# Patient Record
Sex: Male | Born: 1950 | Race: White | Hispanic: No | Smoking: Former smoker
Health system: Southern US, Community
[De-identification: ages and names within clinical notes are randomized; demographics above are authoritative.]

## PROBLEM LIST (undated history)

## (undated) DIAGNOSIS — E7800 Pure hypercholesterolemia, unspecified: Secondary | ICD-10-CM

## (undated) DIAGNOSIS — N400 Enlarged prostate without lower urinary tract symptoms: Secondary | ICD-10-CM

## (undated) DIAGNOSIS — Z87442 Personal history of urinary calculi: Secondary | ICD-10-CM

## (undated) DIAGNOSIS — H8109 Meniere's disease, unspecified ear: Secondary | ICD-10-CM

## (undated) DIAGNOSIS — I10 Essential (primary) hypertension: Secondary | ICD-10-CM

## (undated) DIAGNOSIS — T148XXA Other injury of unspecified body region, initial encounter: Secondary | ICD-10-CM

## (undated) DIAGNOSIS — F419 Anxiety disorder, unspecified: Secondary | ICD-10-CM

## (undated) DIAGNOSIS — L570 Actinic keratosis: Secondary | ICD-10-CM

## (undated) DIAGNOSIS — H9190 Unspecified hearing loss, unspecified ear: Secondary | ICD-10-CM

## (undated) HISTORY — PX: HEMORRHOID SURGERY: SHX153

## (undated) HISTORY — PX: HERNIA REPAIR: SHX51

## (undated) HISTORY — DX: Actinic keratosis: L57.0

## (undated) HISTORY — PX: COLONOSCOPY: SHX174

---

## 2004-06-23 ENCOUNTER — Ambulatory Visit: Payer: Self-pay | Admitting: Unknown Physician Specialty

## 2004-07-23 ENCOUNTER — Ambulatory Visit: Payer: Self-pay | Admitting: Surgery

## 2005-11-23 ENCOUNTER — Encounter: Payer: Self-pay | Admitting: Orthopaedic Surgery

## 2005-12-15 ENCOUNTER — Encounter: Payer: Self-pay | Admitting: Orthopaedic Surgery

## 2009-06-12 ENCOUNTER — Ambulatory Visit: Payer: Self-pay | Admitting: Surgery

## 2009-11-17 ENCOUNTER — Ambulatory Visit: Payer: Self-pay | Admitting: Unknown Physician Specialty

## 2015-10-06 DIAGNOSIS — H8109 Meniere's disease, unspecified ear: Secondary | ICD-10-CM | POA: Insufficient documentation

## 2016-02-23 DIAGNOSIS — Z85828 Personal history of other malignant neoplasm of skin: Secondary | ICD-10-CM

## 2016-02-23 HISTORY — DX: Personal history of other malignant neoplasm of skin: Z85.828

## 2016-05-31 ENCOUNTER — Emergency Department
Admission: EM | Admit: 2016-05-31 | Discharge: 2016-06-01 | Disposition: A | Discharge status: Home / Self Care | Payer: Medicare HMO | Attending: Emergency Medicine | Admitting: Emergency Medicine

## 2016-05-31 ENCOUNTER — Encounter: Payer: Self-pay | Admitting: Emergency Medicine

## 2016-05-31 DIAGNOSIS — F32A Depression, unspecified: Secondary | ICD-10-CM

## 2016-05-31 DIAGNOSIS — F4325 Adjustment disorder with mixed disturbance of emotions and conduct: Secondary | ICD-10-CM | POA: Diagnosis not present

## 2016-05-31 DIAGNOSIS — Z046 Encounter for general psychiatric examination, requested by authority: Secondary | ICD-10-CM | POA: Diagnosis present

## 2016-05-31 DIAGNOSIS — F1010 Alcohol abuse, uncomplicated: Secondary | ICD-10-CM

## 2016-05-31 DIAGNOSIS — Z5181 Encounter for therapeutic drug level monitoring: Secondary | ICD-10-CM | POA: Insufficient documentation

## 2016-05-31 DIAGNOSIS — F329 Major depressive disorder, single episode, unspecified: Secondary | ICD-10-CM | POA: Insufficient documentation

## 2016-05-31 DIAGNOSIS — F4329 Adjustment disorder with other symptoms: Secondary | ICD-10-CM

## 2016-05-31 LAB — CBC
HCT: 42 % (ref 40.0–52.0)
Hemoglobin: 14.2 g/dL (ref 13.0–18.0)
MCH: 31.4 pg (ref 26.0–34.0)
MCHC: 33.8 g/dL (ref 32.0–36.0)
MCV: 92.8 fL (ref 80.0–100.0)
PLATELETS: 227 10*3/uL (ref 150–440)
RBC: 4.52 MIL/uL (ref 4.40–5.90)
RDW: 14.5 % (ref 11.5–14.5)
WBC: 6.1 10*3/uL (ref 3.8–10.6)

## 2016-05-31 LAB — COMPREHENSIVE METABOLIC PANEL
ALBUMIN: 4.5 g/dL (ref 3.5–5.0)
ALT: 36 U/L (ref 17–63)
AST: 38 U/L (ref 15–41)
Alkaline Phosphatase: 66 U/L (ref 38–126)
Anion gap: 7 (ref 5–15)
BUN: 13 mg/dL (ref 6–20)
CO2: 28 mmol/L (ref 22–32)
Calcium: 9.3 mg/dL (ref 8.9–10.3)
Chloride: 108 mmol/L (ref 101–111)
Creatinine, Ser: 1.07 mg/dL (ref 0.61–1.24)
GFR calc Af Amer: >60 mL/min (ref >60)
GLUCOSE: 93 mg/dL (ref 65–99)
Potassium: 4 mmol/L (ref 3.5–5.1)
Sodium: 143 mmol/L (ref 135–145)
TOTAL PROTEIN: 7.6 g/dL (ref 6.5–8.1)
Total Bilirubin: 0.6 mg/dL (ref 0.3–1.2)

## 2016-05-31 LAB — SALICYLATE LEVEL: Salicylate Lvl: <7 mg/dL (ref 2.8–30.0)

## 2016-05-31 LAB — URINE DRUG SCREEN, QUALITATIVE (ARMC ONLY)
AMPHETAMINES, UR SCREEN: NOT DETECTED
Barbiturates, Ur Screen: NOT DETECTED
Benzodiazepine, Ur Scrn: NOT DETECTED
CANNABINOID 50 NG, UR ~~LOC~~: NOT DETECTED
Cocaine Metabolite,Ur ~~LOC~~: NOT DETECTED
MDMA (ECSTASY) UR SCREEN: NOT DETECTED
METHADONE SCREEN, URINE: NOT DETECTED
Opiate, Ur Screen: NOT DETECTED
Phencyclidine (PCP) Ur S: NOT DETECTED
TRICYCLIC, UR SCREEN: NOT DETECTED

## 2016-05-31 LAB — ACETAMINOPHEN LEVEL: Acetaminophen (Tylenol), Serum: <10 ug/mL — ABNORMAL LOW (ref 10–30)

## 2016-05-31 LAB — ETHANOL: ALCOHOL ETHYL (B): 150 mg/dL — AB (ref <5)

## 2016-05-31 MED ORDER — TRAZODONE HCL 50 MG PO TABS
50.0000 mg | ORAL_TABLET | Freq: Every evening | ORAL | Status: DC | PRN
  Administered 2016-05-31: 50 mg via ORAL
  Filled 2016-05-31: qty 1

## 2016-05-31 MED ORDER — VITAMIN B-1 100 MG PO TABS
100.0000 mg | ORAL_TABLET | Freq: Every day | ORAL | Status: DC
  Administered 2016-06-01: 100 mg via ORAL
  Filled 2016-05-31: qty 1

## 2016-05-31 MED ORDER — CHLORDIAZEPOXIDE HCL 10 MG PO CAPS
10.0000 mg | ORAL_CAPSULE | Freq: Three times a day (TID) | ORAL | Status: DC | PRN
  Administered 2016-06-01 (×2): 10 mg via ORAL
  Filled 2016-05-31 (×2): qty 1

## 2016-05-31 NOTE — ED Notes (Signed)
Report given to SOC 

## 2016-05-31 NOTE — ED Notes (Addendum)
SOC finished. This RN went to remove [facility] machine.  Pt lying in bed.  Informed pt that he would be moved to [facility].  Pt sts that he broke one hearing aid (R) by lying on side of head.  Broken hearing aid (2 pieces) placed in speicmen cup.  Pt label placed on cup and placed in pt belongings bag.

## 2016-05-31 NOTE — ED Notes (Signed)
Pt. To BHU from ED ambulatory without difficulty, to room  BHU2. Report from [facility]. Pt. Is alert and oriented, warm and dry in no distress. Pt. Denies SI, HI, and AVH. Pt states he just said something he should have not said and that is why he is here. Pt states he did not mean what he said. Pt asking for something to help him sleep. EDP made aware. Pt. Calm and cooperative. Pt. Made aware of security cameras and Q15 minute rounds. Pt. Encouraged to let Nursing staff know of any concerns or needs.

## 2016-05-31 NOTE — ED Notes (Signed)

## 2016-05-31 NOTE — ED Provider Notes (Signed)
[facility] Emergency Department Provider Note   ____________________________________________   I have reviewed the triage vital signs and the nursing notes.   HISTORY  Chief Complaint Psychiatric Evaluation   History limited by: Not Limited   HPI Scott Cochran is a 66 y.o. male who presents to the emergency department today because of concern for suicidal ideation and under IVC. The patient's wife is currently ill and is under care of hospice. The patient has understandably upset about this and has talked to his primary care doctor. The patient was placed on depression medication however is no longer taking that. The patient was with hospice today and was upset over the care of his wife. He stated that he wanted to kill himself and the hospice nurse took out IVC paperwork. Patient currently denies any SI. Denies any medical complaints.    History reviewed. No pertinent past medical history.  There are no active problems to display for this patient.   History reviewed. No pertinent surgical history.  Prior to Admission medications   Not on File    Allergies Patient has no known allergies.  History reviewed. No pertinent family history.  Social History Social History  Substance Use Topics  . Smoking status: Never Smoker  . Smokeless tobacco: Never Used  . Alcohol use Yes    Review of Systems  Constitutional: Negative for fever. Cardiovascular: Negative for chest pain. Respiratory: Negative for shortness of breath. Gastrointestinal: Negative for abdominal pain, vomiting and diarrhea. Genitourinary: Negative for dysuria. Musculoskeletal: Negative for back pain. Skin: Negative for rash. Neurological: Negative for headaches, focal weakness or numbness. Psychiatric: Positive for depression 10-point ROS otherwise negative.  ____________________________________________   PHYSICAL EXAM:  VITAL SIGNS: ED Triage Vitals  Enc Vitals Group      BP 05/31/16 1806 (!) 154/92     Pulse Rate 05/31/16 1806 93     Resp 05/31/16 1806 18     Temp 05/31/16 1806 98 F (36.7 C)     Temp Source 05/31/16 1806 Oral     SpO2 05/31/16 1806 96 %     Weight 05/31/16 1806 178 lb (80.7 kg)     Height 05/31/16 1806 5\' 10"  (1.778 m)     Head Circumference --      Peak Flow --      Pain Score 05/31/16 1817 0   Constitutional: Alert and oriented. Depressed, tearful.  Eyes: Conjunctivae are normal. Normal extraocular movements. ENT   Head: Normocephalic and atraumatic.   Nose: No congestion/rhinnorhea.   Mouth/Throat: Mucous membranes are moist.   Neck: No stridor. Hematological/Lymphatic/Immunilogical: No cervical lymphadenopathy. Cardiovascular: Normal rate, regular rhythm.  No murmurs, rubs, or gallops.  Respiratory: Normal respiratory effort without tachypnea nor retractions. Breath sounds are clear and equal bilaterally. No wheezes/rales/rhonchi. Gastrointestinal: Soft and non tender. No rebound. No guarding.  Genitourinary: Deferred Musculoskeletal: Normal range of motion in all extremities. No lower extremity edema. Neurologic:  Normal speech and language. No gross focal neurologic deficits are appreciated.  Skin:  Skin is warm, dry and intact. No rash noted. Psychiatric: Depressed, tearful.  ____________________________________________    LABS (pertinent positives/negatives)  Labs Reviewed  ETHANOL - Abnormal; Notable for the following:       Result Value   Alcohol, Ethyl (B) 150 (*)    All other components within normal limits  ACETAMINOPHEN LEVEL - Abnormal; Notable for the following:    Acetaminophen (Tylenol), Serum <10 (*)    All other components within normal limits  COMPREHENSIVE METABOLIC PANEL  SALICYLATE LEVEL  CBC  URINE DRUG SCREEN, QUALITATIVE ([facility])     ____________________________________________   EKG  None  ____________________________________________     RADIOLOGY  None  ____________________________________________   PROCEDURES  Procedures  ____________________________________________   INITIAL IMPRESSION / ASSESSMENT AND PLAN / ED COURSE  Pertinent labs & imaging results that were available during my care of the patient were reviewed by me and considered in my medical decision making (see chart for details).  Patient presented to the emergency department under IVC for SI. Patient is clearly depressed secondary to wife's illness. Will continue IVC and have psychiatry evaluate.   SOC recommends inpatient admission at this time, [facility] did recommend some medications. ____________________________________________   FINAL CLINICAL IMPRESSION(S) / ED DIAGNOSES  Final diagnoses:  Depression, unspecified depression type     Note: This dictation was prepared with Dragon dictation. Any transcriptional errors that result from this process are unintentional     Nance Pear, MD 05/31/16 2358

## 2016-05-31 NOTE — BH Assessment (Signed)
Probation officer spoke with [facility] Department, [employer] [phone]. Candida Peeling). He states the patient was placed under IVC by his wife's Designer, fashion/clothing, due to the statement, "I can just get a gun and shoot myself." Officer further reports, when he arrived to the home, the patient was calm, cooperative and pleasant. Both patient's son and grandson was at the home and reported the patient was not a threat to himself or anyone else. "He must have said it out of frustration." With the officer, patient denied SI/HI and AV/H. The incident occurred in patient's home. HOSPICE comes to their home.  Patient was up majority of the night with his wife and haven't had much sleep, per the report of the officer.

## 2016-05-31 NOTE — ED Notes (Signed)
ivc 

## 2016-05-31 NOTE — ED Notes (Signed)
ED BHU [place] Is the patient under IVC or is there intent for IVC: Yes.   Is the patient medically cleared: Yes.   Is there vacancy in the ED BHU: Yes.   Is the population mix appropriate for patient: Yes.   Is the patient awaiting placement in inpatient or outpatient setting: Yes.   Has the patient had a psychiatric consult: Yes.   Survey of unit performed for contraband, proper placement and condition of furniture, tampering with fixtures in bathroom, shower, and each patient room: Yes.  ; Findings: NA APPEARANCE/BEHAVIOR calm, cooperative and adequate rapport can be established NEURO ASSESSMENT Orientation: time, place and person Hallucinations: No.None noted (Hallucinations) Speech: Normal Gait: normal RESPIRATORY ASSESSMENT Normal expansion.  Clear to auscultation.  No rales, rhonchi, or wheezing. CARDIOVASCULAR ASSESSMENT regular rate and rhythm, S1, S2 normal, no murmur, click, rub or gallop GASTROINTESTINAL ASSESSMENT soft, nontender, BS WNL, no r/g EXTREMITIES normal strength, tone, and muscle mass, no deformities, no erythema, induration, or nodules PLAN OF CARE Provide calm/safe environment. Vital signs assessed twice daily. ED BHU Assessment once each 12-hour shift. Collaborate with intake RN daily or as condition indicates. Assure the ED provider has rounded once each shift. Provide and encourage hygiene. Provide redirection as needed. Assess for escalating behavior; address immediately and inform ED provider.  Assess family dynamic and appropriateness for visitation as needed: Yes.  ; If necessary, describe findings: NA Educate the patient/family about BHU procedures/visitation: Yes.  ; If necessary, describe findings: NA

## 2016-05-31 NOTE — ED Notes (Signed)
SOC at bedside. 

## 2016-05-31 NOTE — ED Notes (Signed)
Report received from [facility]. Patient to be placed in [place].

## 2016-05-31 NOTE — ED Notes (Signed)
Pts grandson given pts pass code [phone] w/ pts permission.  Informed pt that he would be able to talk w/ grandson outside of normal phone hours this evening

## 2016-05-31 NOTE — ED Notes (Signed)
Update given to family w/ pass code

## 2016-05-31 NOTE — ED Triage Notes (Addendum)
Pt to ed with IVC papers.  Pt wife is in hospice care at home and states he has been so tired and sleep deprived.  Pt wife has been in pain, and he is her primary caregiver and has to give his wife morphine for pain.  Pt states he has not been sleeping well for a long time. Papers are from hospice social worker who reports pt is depressed.  Pt denies SI, denies HI.

## 2016-06-01 DIAGNOSIS — F4325 Adjustment disorder with mixed disturbance of emotions and conduct: Secondary | ICD-10-CM

## 2016-06-01 DIAGNOSIS — F1010 Alcohol abuse, uncomplicated: Secondary | ICD-10-CM

## 2016-06-01 NOTE — ED Provider Notes (Signed)
-----------------------------------------   6:54 AM on 06/01/2016 -----------------------------------------   Blood pressure (!) 155/102, pulse 67, temperature 98.5 F (36.9 C), temperature source Oral, resp. rate 20, height 5\' 10"  (1.778 m), weight 178 lb (80.7 kg), SpO2 97 %.  The patient had no acute events since last update.  Calm and cooperative at this time.  Disposition is pending Psychiatry/Behavioral Medicine team recommendations.     Paulette Blanch, MD 06/01/16 [phone]

## 2016-06-01 NOTE — Consult Note (Signed)
[facility] Face-to-Face Psychiatry Consult   Reason for Consult:  Consult for 66 year old man brought to the emergency room under involuntary commitment Referring Physician:  [place] Patient Identification: Scott Cochran MRN:  [id number] Principal Diagnosis: Adjustment disorder with mixed disturbance of emotions and conduct Diagnosis:   Patient Active Problem List   Diagnosis Date Noted  . Adjustment disorder with mixed disturbance of emotions and conduct [F43.25] 06/01/2016  . Alcohol abuse [F10.10] 06/01/2016    Total Time spent with patient: 1 hour  Subjective:   Scott Cochran is a 66 y.o. male patient admitted with "it was my choice of words that was a problem".  HPI:  Patient interviewed. Chart reviewed. 66 year old man brought to the emergency room yesterday under involuntary commitment that was filed by hospice providers. Patient was at home yesterday in the middle of the day while hospice was there helping out with his terminally ill wife. Hospice workers overheard the patient speaking to his brother and some other members of the family. Patient admits that he has been very stressed out and was having a very emotional conversation and that he made a comment about how he might as well get a gun and shoot himself. Patient says he was intoxicated at the time having consumed much of a 750 mL bottle of liquor during the morning. He denies that he was actually intending or had any plan to kill himself. Patient states that he is under a lot of stress but doesn't feel hopeless necessarily. Doesn't feel constantly depressed. His wife is terminally ill and he is the primary caretaker at home with only a few hours a week of hospice respite right now which has been very emotionally overwhelming for him. Patient says that he sleeps adequately although he sometimes takes over-the-counter sleep medicine. His appetite is been okay. He denies any hallucinations denies any thoughts of hurting anyone else. Patient says  that he drinks about 3 times a week and usually does not drink nearly as much as he did yesterday denies any other drug abuse. Not currently receiving any psychiatric treatment.  Medical history: Patient takes Zocor but otherwise is pretty healthy no other history of major medical problems reported.  Social history: Patient's wife of 4 years is terminally ill with pulmonary fibrosis. Patient has to provide all of her care except for a few hours a week. He does get support from his extended family who live nearby and reports that he has a good relationship with all of them. Appears to be financially stable. 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 Patient is familiar with hospitalist and feels they have been very helpful.  Substance abuse history: Patient admits that he had quite a bit to drink yesterday morning and he did have an elevated alcohol level on presentation. Claims that he doesn't drink nearly that much most of the time and is only drinking a few days a week. Denies that he's had problems with alcohol or drug abuse in the past. Tends to minimize it but admits that it could be a problem right now.  Past Psychiatric History: No past psychiatric history other than having been prescribed an antidepressant by his primary care doctor at some time in the past around the time that his own father died. Patient did not feel was helpful and stopped it after a short period of time. No history of suicide attempts or hospitalization.  Risk to Self: Is patient at risk for suicide?: No Risk to Others:   Prior Inpatient Therapy:  Prior Outpatient Therapy:    Past Medical History: History reviewed. No pertinent past medical history. History reviewed. No pertinent surgical history. Family History: History reviewed. No pertinent family history. Family Psychiatric  History: He has 2 brothers who have chronic developmental disability but doesn't know of any other family mental health history Social History:   History  Alcohol Use  . Yes     History  Drug Use No    Social History   Social History  . Marital status: Married    Spouse name: N/A  . Number of children: N/A  . Years of education: N/A   Social History Main Topics  . Smoking status: Never Smoker  . Smokeless tobacco: Never Used  . Alcohol use Yes  . Drug use: No  . Sexual activity: Not Asked   Other Topics Concern  . None   Social History Narrative  . None   Additional Social History:    Allergies:  No Known Allergies  Labs:  Results for orders placed or performed during the hospital encounter of 05/31/16 (from the past 48 hour(s))  Comprehensive metabolic panel     Status: None   Collection Time: 05/31/16  6:25 PM  Result Value Ref Range   Sodium 143 135 - 145 mmol/L   Potassium 4.0 3.5 - 5.1 mmol/L   Chloride 108 101 - 111 mmol/L   CO2 28 22 - 32 mmol/L   Glucose, Bld 93 65 - 99 mg/dL   BUN 13 6 - 20 mg/dL   Creatinine, Ser 1.07 0.61 - 1.24 mg/dL   Calcium 9.3 8.9 - 10.3 mg/dL   Total Protein 7.6 6.5 - 8.1 g/dL   Albumin 4.5 3.5 - 5.0 g/dL   AST 38 15 - 41 U/L   ALT 36 17 - 63 U/L   Alkaline Phosphatase 66 38 - 126 U/L   Total Bilirubin 0.6 0.3 - 1.2 mg/dL   GFR calc non Af Amer >60 >60 mL/min   GFR calc Af Amer >60 >60 mL/min    Comment: (NOTE) The eGFR has been calculated using the CKD EPI equation. This calculation has not been validated in all clinical situations. eGFR's persistently <60 mL/min signify possible Chronic Kidney Disease.    Anion gap 7 5 - 15  Ethanol     Status: Abnormal   Collection Time: 05/31/16  6:25 PM  Result Value Ref Range   Alcohol, Ethyl (B) 150 (H) <5 mg/dL    Comment:        LOWEST DETECTABLE LIMIT FOR SERUM ALCOHOL IS 5 mg/dL FOR MEDICAL PURPOSES ONLY   Salicylate level     Status: None   Collection Time: 05/31/16  6:25 PM  Result Value Ref Range   Salicylate Lvl <5.8 2.8 - 30.0 mg/dL  Acetaminophen level     Status: Abnormal   Collection Time: 05/31/16   6:25 PM  Result Value Ref Range   Acetaminophen (Tylenol), Serum <10 (L) 10 - 30 ug/mL    Comment:        THERAPEUTIC CONCENTRATIONS VARY SIGNIFICANTLY. A RANGE OF 10-30 ug/mL MAY BE AN EFFECTIVE CONCENTRATION FOR MANY PATIENTS. HOWEVER, SOME ARE BEST TREATED AT CONCENTRATIONS OUTSIDE THIS RANGE. ACETAMINOPHEN CONCENTRATIONS >150 ug/mL AT 4 HOURS AFTER INGESTION AND >50 ug/mL AT 12 HOURS AFTER INGESTION ARE OFTEN ASSOCIATED WITH TOXIC REACTIONS.   cbc     Status: None   Collection Time: 05/31/16  6:25 PM  Result Value Ref Range   WBC 6.1 3.8 - 10.6  K/uL   RBC 4.52 4.40 - 5.90 MIL/uL   Hemoglobin 14.2 13.0 - 18.0 g/dL   HCT 42.0 40.0 - 52.0 %   MCV 92.8 80.0 - 100.0 fL   MCH 31.4 26.0 - 34.0 pg   MCHC 33.8 32.0 - 36.0 g/dL   RDW 14.5 11.5 - 14.5 %   Platelets 227 150 - 440 K/uL  Urine Drug Screen, Qualitative     Status: None   Collection Time: 05/31/16  6:25 PM  Result Value Ref Range   Tricyclic, Ur Screen NONE DETECTED NONE DETECTED   Amphetamines, Ur Screen NONE DETECTED NONE DETECTED   MDMA (Ecstasy)Ur Screen NONE DETECTED NONE DETECTED   Cocaine Metabolite,Ur [place] NONE DETECTED NONE DETECTED   Opiate, Ur Screen NONE DETECTED NONE DETECTED   Phencyclidine (PCP) Ur S NONE DETECTED NONE DETECTED   Cannabinoid 50 Ng, Ur [place] NONE DETECTED NONE DETECTED   Barbiturates, Ur Screen NONE DETECTED NONE DETECTED   Benzodiazepine, Ur Scrn NONE DETECTED NONE DETECTED   Methadone Scn, Ur NONE DETECTED NONE DETECTED    Comment: (NOTE) 034  Tricyclics, urine               Cutoff 1000 ng/mL 200  Amphetamines, urine             Cutoff 1000 ng/mL 300  MDMA (Ecstasy), urine           Cutoff 500 ng/mL 400  Cocaine Metabolite, urine       Cutoff 300 ng/mL 500  Opiate, urine                   Cutoff 300 ng/mL 600  Phencyclidine (PCP), urine      Cutoff 25 ng/mL 700  Cannabinoid, urine              Cutoff 50 ng/mL 800  Barbiturates, urine             Cutoff 200 ng/mL 900   Benzodiazepine, urine           Cutoff 200 ng/mL 1000 Methadone, urine                Cutoff 300 ng/mL 1100 1200 The urine drug screen provides only a preliminary, unconfirmed 1300 analytical test result and should not be used for non-medical 1400 purposes. Clinical consideration and professional judgment should 1500 be applied to any positive drug screen result due to possible 1600 interfering substances. A more specific alternate chemical method 1700 must be used in order to obtain a confirmed analytical result.  1800 Gas chromato graphy / mass spectrometry (GC/MS) is the preferred 1900 confirmatory method.     Current Facility-Administered Medications  Medication Dose Route Frequency Provider Last Rate Last Dose  . chlordiazePOXIDE (LIBRIUM) capsule 10 mg  10 mg Oral TID PRN Nance Pear, MD   10 mg at 06/01/16 0935  . thiamine (VITAMIN B-1) tablet 100 mg  100 mg Oral Daily Nance Pear, MD   100 mg at 06/01/16 0935  . traZODone (DESYREL) tablet 50 mg  50 mg Oral QHS PRN Nance Pear, MD   50 mg at 05/31/16 2233   Current Outpatient Prescriptions  Medication Sig Dispense Refill  . simvastatin (ZOCOR) 40 MG tablet Take 40 mg by mouth daily.      Musculoskeletal: Strength & Muscle Tone: within normal limits Gait & Station: normal Patient leans: N/A  Psychiatric Specialty Exam: Physical Exam  Nursing note and vitals reviewed. Constitutional: He appears well-developed and well-nourished.  HENT:  Head: Normocephalic and atraumatic.  Eyes: Conjunctivae are normal. Pupils are equal, round, and reactive to light.  Neck: Normal range of motion.  Cardiovascular: Regular rhythm and normal heart sounds.   Respiratory: Effort normal. No respiratory distress.  GI: Soft.  Musculoskeletal: Normal range of motion.  Neurological: He is alert.  Skin: Skin is warm and dry.  Psychiatric: He has a normal mood and affect. His speech is normal and behavior is normal. Judgment and  thought content normal. Cognition and memory are normal.    Review of Systems  Constitutional: Negative.   HENT: Negative.   Eyes: Negative.   Respiratory: Negative.   Cardiovascular: Negative.   Gastrointestinal: Negative.   Musculoskeletal: Negative.   Skin: Negative.   Neurological: Negative.   Psychiatric/Behavioral: Positive for substance abuse and suicidal ideas. Negative for depression, hallucinations and memory loss. The patient is not nervous/anxious and does not have insomnia.     Blood pressure (!) 155/102, pulse 67, temperature 98.5 F (36.9 C), temperature source Oral, resp. rate 20, height 5' 10"  (1.778 m), weight 80.7 kg (178 lb), SpO2 97 %.Body mass index is 25.54 kg/m.  General Appearance: Fairly Groomed  Eye Contact:  Good  Speech:  Clear and Coherent  Volume:  Normal  Mood:  Euthymic  Affect:  Constricted  Thought Process:  Goal Directed  Orientation:  Full (Time, Place, and Person)  Thought Content:  Logical  Suicidal Thoughts:  No  Homicidal Thoughts:  No  Memory:  Immediate;   Good Recent;   Fair Remote;   Fair  Judgement:  Fair  Insight:  Fair  Psychomotor Activity:  Normal  Concentration:  Concentration: Fair  Recall:  [employer] of Knowledge:  Fair  Language:  Fair  Akathisia:  No  Handed:  Right  AIMS (if indicated):     Assets:  Communication Skills Desire for Improvement Financial Resources/Insurance Housing Physical Health Resilience Social Support  ADL's:  Intact  Cognition:  WNL  Sleep:        Treatment Plan Summary: Plan 66 year old man who made a suicidal statement yesterday but had not shown any action on it. He was intoxicated at the time. He has consistently denied any suicidal intent or plan since then and is not giving a history of a full depression. Patient is willing to admit that the alcohol could create problems and that he needs to cut back on it. He shows reasonable insight into how his mood could potentially be a  problem. Patient does have firearms at home but agrees to continue keeping them locked up so that he at least does not have immediate impulsive access to them. He is encouraged to discuss further problems with mood with his primary care doctor or with the hospitalist providers. Also has family support. At this point does not meet commitment criteria does not need inpatient hospitalization. Case reviewed with TTS and emergency room doctor. Discontinue IVC. Patient can be released from the emergency room.  Disposition: Patient does not meet criteria for psychiatric inpatient admission. Supportive therapy provided about ongoing stressors.  Alethia Berthold, MD 06/01/2016 1:35 PM

## 2016-06-01 NOTE — ED Provider Notes (Signed)
-----------------------------------------   1:23 PM on 06/01/2016 -----------------------------------------  Evaluated by Dr. Lissa Hoard packs of psychiatry, he feels the patient is not a danger to self or others. Patient has no SI or HI and contracts for safety. Dr. of psychiatry has reversed the patient's IVC and advises discharge. Patient will return for new or worrisome symptoms.   Schuyler Amor, MD 06/01/16 1323

## 2016-06-01 NOTE — ED Notes (Signed)
CIWA= 7 PRN Librium given.

## 2016-06-01 NOTE — ED Notes (Addendum)
Continues to be calm and cooperative. Appears depressed but does brighten in conversation. Continues to be hopeful for D/C and downplays the comments he made about self harm. Does not endorse withdrawal symptoms and no subjective signs noted. He denies SI/HI/AVH and contracts for safety. Support and encouragement provided. Denies pain and/or discomfort. He offeres no additional questions or concerns. Safety has been maintained with q15 min obs, hourly rounding and ODS obs. Will continue current POC pending disposition.

## 2016-06-01 NOTE — ED Notes (Signed)
Pt stable for D/C per Dr. Weber Cooks. Continues to deny SI/HI/AVH. He denies pain and or discomfort. No acute distress noted. Will follow up with his PCP for management of Depression. AVS provided and reviewed. Understanding verbalized. He was escorted to the lobby where he will wait on his family to pick him up.

## 2018-06-20 DIAGNOSIS — F5104 Psychophysiologic insomnia: Secondary | ICD-10-CM | POA: Insufficient documentation

## 2019-04-30 ENCOUNTER — Other Ambulatory Visit: Payer: Self-pay | Admitting: Dermatology

## 2019-04-30 ENCOUNTER — Other Ambulatory Visit: Payer: Self-pay

## 2019-05-01 ENCOUNTER — Other Ambulatory Visit: Payer: Self-pay | Admitting: General Surgery

## 2019-05-02 NOTE — Telephone Encounter (Signed)
Error

## 2019-05-14 ENCOUNTER — Other Ambulatory Visit: Payer: Self-pay

## 2019-05-14 ENCOUNTER — Encounter
Admission: RE | Admit: 2019-05-14 | Discharge: 2019-05-14 | Disposition: A | Discharge status: Home / Self Care | Payer: Medicare HMO | Source: Ambulatory Visit | Attending: General Surgery | Admitting: General Surgery

## 2019-05-14 HISTORY — DX: Other injury of unspecified body region, initial encounter: T14.8XXA

## 2019-05-14 HISTORY — DX: Anxiety disorder, unspecified: F41.9

## 2019-05-14 HISTORY — DX: Essential (primary) hypertension: I10

## 2019-05-14 HISTORY — DX: Meniere's disease, unspecified ear: H81.09

## 2019-05-14 HISTORY — DX: Pure hypercholesterolemia, unspecified: E78.00

## 2019-05-14 HISTORY — DX: Benign prostatic hyperplasia without lower urinary tract symptoms: N40.0

## 2019-05-14 HISTORY — DX: Personal history of urinary calculi: Z87.442

## 2019-05-14 HISTORY — DX: Unspecified hearing loss, unspecified ear: H91.90

## 2019-05-14 NOTE — Patient Instructions (Addendum)
Your procedure is scheduled on: Friday 4/9 Report to Day Surgery. To find out your arrival time please call [phone] between 1PM - 3PM on Thurs 4/8  Remember: Instructions that are not followed completely may result in serious medical risk,  up to and including death, or upon the discretion of your surgeon and anesthesiologist your  surgery may need to be rescheduled.     _X__ 1. You will be on a clear liquid diet for your colonoscopy.  You must stop drinking 2 hours before your arrival.                 No gum chewing or hard candies. DO not drink clear                 liquids within 2 hours of the start of your surgery.                 Clear Liquids include:  water, apple juice without pulp, clear Gatorade, G2 or                  Gatorade Zero (avoid Red/Purple/Blue), Black Coffee or Tea (Do not add                 anything to coffee or tea). _____2.   Complete the carbohydrate drink provided to you, 2 hours before arrival.  __X__2.  On the morning of surgery brush your teeth with toothpaste and water, you                may rinse your mouth with mouthwash if you wish.  Do not swallow any toothpaste of mouthwash.     _X__ 3.  No Alcohol for 24 hours before or after surgery.   ___ 4.  Do Not Smoke or use e-cigarettes For 24 Hours Prior to Your Surgery.                 Do not use any chewable tobacco products for at least 6 hours prior to                 surgery.  ____  5.  Bring all medications with you on the day of surgery if instructed.   __x__  6.  Notify your doctor if there is any change in your medical condition      (cold, fever, infections).     Do not wear jewelry,  Do not wear lotions, You may wear deodorant. Do not shave 48 hours prior to surgery. Men may shave face and neck. Do not bring valuables to the hospital.    [facility] is not responsible for any belongings or valuables.  Contacts, dentures or bridgework may not be worn into  surgery. Leave your suitcase in the car. After surgery it may be brought to your room. For patients admitted to the hospital, discharge time is determined by your treatment team.   Patients discharged the day of surgery will not be allowed to drive home.   Make arrangements for someone to be with you for the first 24 hours of your Same Day Discharge.    Please read over the following fact sheets that you were given:       __x__ Take these medicines the morning of surgery with A SIP OF WATER:    1. cetirizine (ZYRTEC) 5 MG tablet if needed  2. simvastatin (ZOCOR) 40 MG tablet  3.   4.  5.  6.  ____ Fleet Enema (as  directed)   __x__ Use CHG Soap (or wipes) as directed  ____ Use Benzoyl Peroxide Gel as instructed  ____ Use inhalers on the day of surgery  ____ Stop metformin 2 days prior to surgery    ____ Take 1/2 of usual insulin dose the night before surgery. No insulin the morning          of surgery.   ____ Stop Coumadin/Plavix/aspirin   __x__ Stop Anti-inflammatories No ibuprofen aleve or aspirin until after the surgery  May take tylenol   __x__ Stop supplements until after surgery.  ascorbic acid (VITAMIN C) 500 MG tablet  ____ Bring C-Pap to the hospital.

## 2019-05-16 ENCOUNTER — Encounter
Admission: RE | Admit: 2019-05-16 | Discharge: 2019-05-16 | Disposition: A | Discharge status: Home / Self Care | Payer: Medicare HMO | Source: Ambulatory Visit | Attending: General Surgery | Admitting: General Surgery

## 2019-05-16 ENCOUNTER — Other Ambulatory Visit: Payer: Self-pay

## 2019-05-16 DIAGNOSIS — Z01810 Encounter for preprocedural cardiovascular examination: Secondary | ICD-10-CM | POA: Diagnosis not present

## 2019-05-16 DIAGNOSIS — R001 Bradycardia, unspecified: Secondary | ICD-10-CM | POA: Insufficient documentation

## 2019-05-16 DIAGNOSIS — Z01812 Encounter for preprocedural laboratory examination: Secondary | ICD-10-CM | POA: Insufficient documentation

## 2019-05-16 LAB — POTASSIUM: Potassium: 4.4 mmol/L (ref 3.5–5.1)

## 2019-05-22 ENCOUNTER — Other Ambulatory Visit: Payer: Self-pay

## 2019-05-22 ENCOUNTER — Other Ambulatory Visit
Admission: RE | Admit: 2019-05-22 | Discharge: 2019-05-22 | Disposition: A | Discharge status: Home / Self Care | Payer: Medicare HMO | Source: Ambulatory Visit | Attending: General Surgery | Admitting: General Surgery

## 2019-05-22 DIAGNOSIS — Z01812 Encounter for preprocedural laboratory examination: Secondary | ICD-10-CM | POA: Diagnosis present

## 2019-05-22 DIAGNOSIS — Z20822 Contact with and (suspected) exposure to covid-19: Secondary | ICD-10-CM | POA: Insufficient documentation

## 2019-05-22 LAB — SARS CORONAVIRUS 2 (TAT 6-24 HRS): SARS Coronavirus 2: NEGATIVE

## 2019-05-24 ENCOUNTER — Other Ambulatory Visit: Payer: Self-pay

## 2019-05-24 ENCOUNTER — Encounter
Admission: RE | Disposition: A | Discharge status: Home / Self Care | Payer: Self-pay | Source: Home / Self Care | Attending: General Surgery

## 2019-05-24 ENCOUNTER — Encounter: Payer: Self-pay | Admitting: General Surgery

## 2019-05-24 ENCOUNTER — Ambulatory Visit
Admission: RE | Admit: 2019-05-24 | Discharge: 2019-05-24 | Disposition: A | Discharge status: Home / Self Care | Payer: Medicare HMO | Attending: General Surgery | Admitting: General Surgery

## 2019-05-24 ENCOUNTER — Ambulatory Visit: Payer: Medicare HMO | Admitting: Registered Nurse

## 2019-05-24 DIAGNOSIS — Z1211 Encounter for screening for malignant neoplasm of colon: Secondary | ICD-10-CM | POA: Diagnosis not present

## 2019-05-24 DIAGNOSIS — Z882 Allergy status to sulfonamides status: Secondary | ICD-10-CM | POA: Insufficient documentation

## 2019-05-24 DIAGNOSIS — Z79899 Other long term (current) drug therapy: Secondary | ICD-10-CM | POA: Diagnosis not present

## 2019-05-24 DIAGNOSIS — Z800 Family history of malignant neoplasm of digestive organs: Secondary | ICD-10-CM | POA: Diagnosis not present

## 2019-05-24 DIAGNOSIS — Z87891 Personal history of nicotine dependence: Secondary | ICD-10-CM | POA: Diagnosis not present

## 2019-05-24 DIAGNOSIS — F419 Anxiety disorder, unspecified: Secondary | ICD-10-CM | POA: Insufficient documentation

## 2019-05-24 DIAGNOSIS — K5730 Diverticulosis of large intestine without perforation or abscess without bleeding: Secondary | ICD-10-CM | POA: Diagnosis not present

## 2019-05-24 DIAGNOSIS — K621 Rectal polyp: Secondary | ICD-10-CM | POA: Insufficient documentation

## 2019-05-24 DIAGNOSIS — K4091 Unilateral inguinal hernia, without obstruction or gangrene, recurrent: Secondary | ICD-10-CM | POA: Diagnosis not present

## 2019-05-24 DIAGNOSIS — E7800 Pure hypercholesterolemia, unspecified: Secondary | ICD-10-CM | POA: Diagnosis not present

## 2019-05-24 DIAGNOSIS — I10 Essential (primary) hypertension: Secondary | ICD-10-CM | POA: Insufficient documentation

## 2019-05-24 HISTORY — PX: COLONOSCOPY WITH PROPOFOL: SHX5780

## 2019-05-24 HISTORY — PX: INGUINAL HERNIA REPAIR: SHX194

## 2019-05-24 SURGERY — COLONOSCOPY WITH PROPOFOL
Anesthesia: General

## 2019-05-24 SURGERY — REPAIR, HERNIA, INGUINAL, ADULT
Anesthesia: General | Laterality: Right

## 2019-05-24 MED ORDER — MIDAZOLAM HCL 2 MG/2ML IJ SOLN
INTRAMUSCULAR | Status: DC | PRN
  Administered 2019-05-24 (×2): 1 mg via INTRAVENOUS

## 2019-05-24 MED ORDER — ONDANSETRON HCL 4 MG/2ML IJ SOLN
INTRAMUSCULAR | Status: DC | PRN
  Administered 2019-05-24: 4 mg via INTRAVENOUS

## 2019-05-24 MED ORDER — SODIUM CHLORIDE 0.9 % IV SOLN: INTRAVENOUS | Status: DC

## 2019-05-24 MED ORDER — BUPIVACAINE HCL (PF) 0.5 % IJ SOLN
INTRAMUSCULAR | Status: AC
  Filled 2019-05-24: qty 30

## 2019-05-24 MED ORDER — LABETALOL HCL 5 MG/ML IV SOLN
INTRAVENOUS | Status: AC
  Filled 2019-05-24: qty 4

## 2019-05-24 MED ORDER — CEFAZOLIN SODIUM-DEXTROSE 2-4 GM/100ML-% IV SOLN
INTRAVENOUS | Status: AC
  Filled 2019-05-24: qty 100

## 2019-05-24 MED ORDER — LIDOCAINE HCL URETHRAL/MUCOSAL 2 % EX GEL
CUTANEOUS | Status: AC
  Filled 2019-05-24: qty 5

## 2019-05-24 MED ORDER — LIDOCAINE HCL (PF) 2 % IJ SOLN
INTRAMUSCULAR | Status: AC
  Filled 2019-05-24: qty 5

## 2019-05-24 MED ORDER — ACETAMINOPHEN 10 MG/ML IV SOLN
INTRAVENOUS | Status: AC
  Filled 2019-05-24: qty 100

## 2019-05-24 MED ORDER — FENTANYL CITRATE (PF) 100 MCG/2ML IJ SOLN: 25.0000 ug | INTRAMUSCULAR | Status: DC | PRN

## 2019-05-24 MED ORDER — FAMOTIDINE 20 MG PO TABS
20.0000 mg | ORAL_TABLET | Freq: Once | ORAL | Status: AC
  Administered 2019-05-24: 08:00:00 20 mg via ORAL

## 2019-05-24 MED ORDER — KETOROLAC TROMETHAMINE 30 MG/ML IJ SOLN
INTRAMUSCULAR | Status: AC
  Filled 2019-05-24: qty 1

## 2019-05-24 MED ORDER — ONDANSETRON HCL 4 MG/2ML IJ SOLN
INTRAMUSCULAR | Status: AC
  Filled 2019-05-24: qty 2

## 2019-05-24 MED ORDER — CEFAZOLIN SODIUM-DEXTROSE 2-4 GM/100ML-% IV SOLN
2.0000 g | INTRAVENOUS | Status: AC
  Administered 2019-05-24: 2 g via INTRAVENOUS

## 2019-05-24 MED ORDER — ACETAMINOPHEN 10 MG/ML IV SOLN
INTRAVENOUS | Status: DC | PRN
  Administered 2019-05-24: 1000 mg via INTRAVENOUS

## 2019-05-24 MED ORDER — EPINEPHRINE PF 1 MG/ML IJ SOLN
INTRAMUSCULAR | Status: AC
  Filled 2019-05-24: qty 1

## 2019-05-24 MED ORDER — BUPIVACAINE-EPINEPHRINE (PF) 0.5% -1:200000 IJ SOLN
INTRAMUSCULAR | Status: DC | PRN
  Administered 2019-05-24: 30 mL

## 2019-05-24 MED ORDER — PROPOFOL 10 MG/ML IV BOLUS
INTRAVENOUS | Status: AC
  Filled 2019-05-24: qty 20

## 2019-05-24 MED ORDER — BISOPROLOL FUMARATE 5 MG PO TABS
2.5000 mg | ORAL_TABLET | Freq: Once | ORAL | Status: AC
  Administered 2019-05-24: 08:00:00 2.5 mg via ORAL
  Filled 2019-05-24: qty 0.5

## 2019-05-24 MED ORDER — SODIUM CHLORIDE 0.9 % IV SOLN
INTRAVENOUS | Status: DC | PRN
  Administered 2019-05-24: 30 ug/min via INTRAVENOUS

## 2019-05-24 MED ORDER — HYDROCODONE-ACETAMINOPHEN 5-325 MG PO TABS: 1.0000 | ORAL_TABLET | ORAL | 0 refills | Status: AC | PRN

## 2019-05-24 MED ORDER — MEPERIDINE HCL 50 MG/ML IJ SOLN: 6.2500 mg | INTRAMUSCULAR | Status: DC | PRN

## 2019-05-24 MED ORDER — FENTANYL CITRATE (PF) 100 MCG/2ML IJ SOLN
INTRAMUSCULAR | Status: AC
  Filled 2019-05-24: qty 2

## 2019-05-24 MED ORDER — OXYCODONE HCL 5 MG/5ML PO SOLN: 5.0000 mg | Freq: Once | ORAL | Status: DC | PRN

## 2019-05-24 MED ORDER — PROPOFOL 500 MG/50ML IV EMUL
INTRAVENOUS | Status: DC | PRN
  Administered 2019-05-24: 140 ug/kg/min via INTRAVENOUS

## 2019-05-24 MED ORDER — EPHEDRINE SULFATE 50 MG/ML IJ SOLN
INTRAMUSCULAR | Status: DC | PRN
  Administered 2019-05-24: 10 mg via INTRAVENOUS
  Administered 2019-05-24 (×2): 5 mg via INTRAVENOUS
  Administered 2019-05-24: 10 mg via INTRAVENOUS

## 2019-05-24 MED ORDER — OXYCODONE HCL 5 MG PO TABS: 5.0000 mg | ORAL_TABLET | Freq: Once | ORAL | Status: DC | PRN

## 2019-05-24 MED ORDER — PROPOFOL 500 MG/50ML IV EMUL
INTRAVENOUS | Status: AC
  Filled 2019-05-24: qty 50

## 2019-05-24 MED ORDER — LABETALOL HCL 5 MG/ML IV SOLN
5.0000 mg | Freq: Once | INTRAVENOUS | Status: AC
  Administered 2019-05-24: 11:00:00 5 mg via INTRAVENOUS

## 2019-05-24 MED ORDER — MIDAZOLAM HCL 2 MG/2ML IJ SOLN
INTRAMUSCULAR | Status: AC
  Filled 2019-05-24: qty 2

## 2019-05-24 MED ORDER — LIDOCAINE HCL (CARDIAC) PF 100 MG/5ML IV SOSY
PREFILLED_SYRINGE | INTRAVENOUS | Status: DC | PRN
  Administered 2019-05-24: 100 mg via INTRAVENOUS

## 2019-05-24 MED ORDER — DEXAMETHASONE SODIUM PHOSPHATE 10 MG/ML IJ SOLN
INTRAMUSCULAR | Status: AC
  Filled 2019-05-24: qty 1

## 2019-05-24 MED ORDER — LACTATED RINGERS IV SOLN: INTRAVENOUS | Status: DC

## 2019-05-24 MED ORDER — DEXAMETHASONE SODIUM PHOSPHATE 10 MG/ML IJ SOLN
INTRAMUSCULAR | Status: DC | PRN
  Administered 2019-05-24: 8 mg via INTRAVENOUS

## 2019-05-24 MED ORDER — FENTANYL CITRATE (PF) 100 MCG/2ML IJ SOLN
INTRAMUSCULAR | Status: DC | PRN
  Administered 2019-05-24 (×2): 25 ug via INTRAVENOUS

## 2019-05-24 MED ORDER — FAMOTIDINE 20 MG PO TABS
ORAL_TABLET | ORAL | Status: AC
  Filled 2019-05-24: qty 1

## 2019-05-24 MED ORDER — PROMETHAZINE HCL 25 MG/ML IJ SOLN: 6.2500 mg | INTRAMUSCULAR | Status: DC | PRN

## 2019-05-24 MED ORDER — EPHEDRINE 5 MG/ML INJ
INTRAVENOUS | Status: AC
  Filled 2019-05-24: qty 10

## 2019-05-24 MED ORDER — PROPOFOL 10 MG/ML IV BOLUS
INTRAVENOUS | Status: DC | PRN
  Administered 2019-05-24: 70 mg via INTRAVENOUS
  Administered 2019-05-24: 20 mg via INTRAVENOUS
  Administered 2019-05-24: 50 mg via INTRAVENOUS
  Administered 2019-05-24: 30 mg via INTRAVENOUS

## 2019-05-24 SURGICAL SUPPLY — 34 items
BLADE SURG 15 STRL SS SAFETY (BLADE) ×6 IMPLANT
CANISTER SUCT 1200ML W/VALVE (MISCELLANEOUS) ×3 IMPLANT
CHLORAPREP W/TINT 26 (MISCELLANEOUS) ×3 IMPLANT
CLOSURE WOUND 1/2 X4 (GAUZE/BANDAGES/DRESSINGS) ×1
COVER WAND RF STERILE (DRAPES) ×3 IMPLANT
DECANTER SPIKE VIAL GLASS SM (MISCELLANEOUS) ×3 IMPLANT
DRAIN PENROSE 1/4X12 LTX STRL (WOUND CARE) ×3 IMPLANT
DRAPE LAPAROTOMY 100X77 ABD (DRAPES) ×3 IMPLANT
DRSG TEGADERM 4X4.75 (GAUZE/BANDAGES/DRESSINGS) ×3 IMPLANT
DRSG TELFA 4X3 1S NADH ST (GAUZE/BANDAGES/DRESSINGS) ×3 IMPLANT
ELECT REM PT RETURN 9FT ADLT (ELECTROSURGICAL) ×3
ELECTRODE REM PT RTRN 9FT ADLT (ELECTROSURGICAL) ×1 IMPLANT
GLOVE BIO SURGEON STRL SZ7.5 (GLOVE) ×3 IMPLANT
GLOVE INDICATOR 8.0 STRL GRN (GLOVE) ×6 IMPLANT
GOWN STRL REUS W/ TWL LRG LVL3 (GOWN DISPOSABLE) ×2 IMPLANT
GOWN STRL REUS W/TWL LRG LVL3 (GOWN DISPOSABLE) ×4
KIT TURNOVER KIT A (KITS) ×3 IMPLANT
LABEL OR SOLS (LABEL) ×3 IMPLANT
MESH MARLEX PLUG MEDIUM (Mesh General) ×3 IMPLANT
NEEDLE HYPO 22GX1.5 SAFETY (NEEDLE) ×6 IMPLANT
PACK BASIN MINOR ARMC (MISCELLANEOUS) ×3 IMPLANT
STRIP CLOSURE SKIN 1/2X4 (GAUZE/BANDAGES/DRESSINGS) ×2 IMPLANT
SUT PDS AB 0 CT1 27 (SUTURE) IMPLANT
SUT SURGILON 0 BLK (SUTURE) ×3 IMPLANT
SUT VIC AB 2-0 SH 27 (SUTURE) ×2
SUT VIC AB 2-0 SH 27XBRD (SUTURE) ×1 IMPLANT
SUT VIC AB 3-0 54X BRD REEL (SUTURE) ×1 IMPLANT
SUT VIC AB 3-0 BRD 54 (SUTURE) ×2
SUT VIC AB 3-0 SH 27 (SUTURE) ×2
SUT VIC AB 3-0 SH 27X BRD (SUTURE) ×1 IMPLANT
SUT VIC AB 4-0 FS2 27 (SUTURE) ×3 IMPLANT
SWABSTK COMLB BENZOIN TINCTURE (MISCELLANEOUS) ×3 IMPLANT
SYR 10ML LL (SYRINGE) ×3 IMPLANT
SYR 3ML LL SCALE MARK (SYRINGE) ×3 IMPLANT

## 2019-05-24 NOTE — Progress Notes (Signed)
Labetalol 5mg  given for blood pressure of 141/95

## 2019-05-24 NOTE — H&P (Signed)
Scott Cochran NB:[id number] Jan 08, 1951     HPI:  Family history of colon cancer.  New recurrent right inguinal hernia. For colonoscopy and right inguinal hernia repair.   Medications Prior to Admission  Medication Sig Dispense Refill Last Dose  . acetaminophen (TYLENOL) 500 MG tablet Take 1,000 mg by mouth every 6 (six) hours as needed for moderate pain.   05/23/2019 at Unknown time  . ascorbic acid (VITAMIN C) 500 MG tablet Take 500 mg by mouth 2 (two) times a week.   Past Week at Unknown time  . bisoprolol-hydrochlorothiazide (ZIAC) 2.5-6.25 MG tablet Take 1 tablet by mouth daily.   05/23/2019 at Unknown time  . cetirizine (ZYRTEC) 5 MG tablet Take 5 mg by mouth daily.   05/23/2019 at Unknown time  . diazepam (VALIUM) 5 MG tablet Take 5 mg by mouth 2 (two) times daily as needed for anxiety.   Past Week at Unknown time  . docusate sodium (COLACE) 100 MG capsule Take 100 mg by mouth daily.   Past Week at Unknown time  . mometasone (ELOCON) 0.1 % lotion Apply 1 application topically daily as needed (psoriasis).      . Multiple Vitamin (MULTIVITAMIN WITH MINERALS) TABS tablet Take 1 tablet by mouth daily.   Past Week at Unknown time  . silodosin (RAPAFLO) 8 MG CAPS capsule Take 8 mg by mouth at bedtime.   05/23/2019 at Unknown time  . simvastatin (ZOCOR) 40 MG tablet Take 20 mg by mouth daily.    Past Week at Unknown time  . vitamin B-12 (CYANOCOBALAMIN) 500 MCG tablet Take 500 mcg by mouth 2 (two) times a week.   Past Week at Unknown time   Allergies  Allergen Reactions  . Sulfa Antibiotics Other (See Comments)    unknown 25 years ago   Past Medical History:  Diagnosis Date  . Anxiety   . BPH (benign prostatic hyperplasia)   . Fracture    left arm  . Hearing loss   . History of kidney stones   . Hx of basal cell carcinoma 02/23/2016   R infranasal medial to superior nasolabial  . Hypercholesterolemia   . Hypertension   . Meniere's disease    Past Surgical History:  Procedure Laterality Date   . COLONOSCOPY    . HEMORRHOID SURGERY    . HERNIA REPAIR Right    Social History   Socioeconomic History  . Marital status: Widowed    Spouse name: Not on file  . Number of children: Not on file  . Years of education: Not on file  . Highest education level: Not on file  Occupational History  . Not on file  Tobacco Use  . Smoking status: Former Research scientist (life sciences)  . Smokeless tobacco: Never Used  . Tobacco comment: 35 years ago  Substance and Sexual Activity  . Alcohol use: Yes    Comment: rarely  . Drug use: No  . Sexual activity: Not on file  Other Topics Concern  . Not on file  Social History Narrative  . Not on file   Social Determinants of Health   Financial Resource Strain:   . Difficulty of Paying Living Expenses:   Food Insecurity:   . Worried About Charity fundraiser in the Last Year:   . Arboriculturist in the Last Year:   Transportation Needs:   . Film/video editor (Medical):   Marland Kitchen Lack of Transportation (Non-Medical):   Physical Activity:   . Days of Exercise per  Week:   . Minutes of Exercise per Session:   Stress:   . Feeling of Stress :   Social Connections:   . Frequency of Communication with Friends and Family:   . Frequency of Social Gatherings with Friends and Family:   . Attends Religious Services:   . Active Member of Clubs or Organizations:   . Attends Archivist Meetings:   Marland Kitchen Marital Status:   Intimate Partner Violence:   . Fear of Current or Ex-Partner:   . Emotionally Abused:   Marland Kitchen Physically Abused:   . Sexually Abused:    Social History   Social History Narrative  . Not on file     ROS: Negative.     PE: HEENT: Negative. Lungs: Clear. Cardio: RR.   Assessment/Plan:  Proceed with planned endoscopy.  [facility] 05/24/2019

## 2019-05-24 NOTE — Transfer of Care (Signed)
Immediate Anesthesia Transfer of Care Note  Patient: Scott Cochran  Procedure(s) Performed: HERNIA REPAIR INGUINAL ADULT (Right )  Patient Location: PACU  Anesthesia Type:General  Level of Consciousness: sedated  Airway & Oxygen Therapy: Patient Spontanous Breathing and Patient connected to face mask oxygen  Post-op Assessment: Report given to RN and Post -op Vital signs reviewed and stable  Post vital signs: Reviewed and stable  Last Vitals:  Vitals Value Taken Time  BP 154/85 05/24/19 1046  Temp 36.3 C 05/24/19 1046  Pulse 68 05/24/19 1047  Resp 13 05/24/19 1047  SpO2 100 % 05/24/19 1047  Vitals shown include unvalidated device data.  Last Pain:  Vitals:   05/24/19 0812  TempSrc: Tympanic  PainSc: 0-No pain         Complications: No apparent anesthesia complications

## 2019-05-24 NOTE — Discharge Instructions (Signed)

## 2019-05-24 NOTE — Anesthesia Procedure Notes (Signed)
Procedure Name: LMA Insertion Date/Time: 05/24/2019 9:51 AM Performed by: Hedda Slade, CRNA Pre-anesthesia Checklist: Patient identified, Patient being monitored, Timeout performed, Emergency Drugs available and Suction available Patient Re-evaluated:Patient Re-evaluated prior to induction Oxygen Delivery Method: Circle system utilized Preoxygenation: Pre-oxygenation with 100% oxygen Induction Type: IV induction Ventilation: Mask ventilation without difficulty LMA: LMA inserted LMA Size: 4.5 Tube type: Oral Number of attempts: 1 Placement Confirmation: positive ETCO2 and breath sounds checked- equal and bilateral Tube secured with: Tape Dental Injury: Teeth and Oropharynx as per pre-operative assessment

## 2019-05-24 NOTE — Anesthesia Preprocedure Evaluation (Signed)
Anesthesia Evaluation  Patient identified by MRN, date of birth, ID band Patient awake    Reviewed: Allergy & Precautions, NPO status , Patient's Chart, lab work & pertinent test results  History of Anesthesia Complications Negative for: history of anesthetic complications  Airway Mallampati: II  TM Distance: >3 FB Neck ROM: Full    Dental  (+) Upper Dentures, Lower Dentures   Pulmonary neg sleep apnea, neg COPD, former smoker,    breath sounds clear to auscultation- rhonchi (-) wheezing      Cardiovascular Exercise Tolerance: Good hypertension, Pt. on medications (-) CAD, (-) Past MI, (-) Cardiac Stents and (-) CABG  Rhythm:Regular Rate:Normal - Systolic murmurs and - Diastolic murmurs    Neuro/Psych neg Seizures PSYCHIATRIC DISORDERS Anxiety negative neurological ROS     GI/Hepatic negative GI ROS, Neg liver ROS,   Endo/Other  negative endocrine ROSneg diabetes  Renal/GU negative Renal ROS     Musculoskeletal negative musculoskeletal ROS (+)   Abdominal (+) - obese,   Peds  Hematology negative hematology ROS (+)   Anesthesia Other Findings Past Medical History: No date: Anxiety No date: BPH (benign prostatic hyperplasia) No date: Fracture     Comment:  left arm No date: Hearing loss No date: History of kidney stones 02/23/2016: Hx of basal cell carcinoma     Comment:  R infranasal medial to superior nasolabial No date: Hypercholesterolemia No date: Hypertension No date: Meniere's disease   Reproductive/Obstetrics                             Anesthesia Physical Anesthesia Plan  ASA: II  Anesthesia Plan: General   Post-op Pain Management:    Induction: Intravenous  PONV Risk Score and Plan: 1 and Dexamethasone and Ondansetron  Airway Management Planned: LMA  Additional Equipment:   Intra-op Plan:   Post-operative Plan:   Informed Consent: I have reviewed the  patients History and Physical, chart, labs and discussed the procedure including the risks, benefits and alternatives for the proposed anesthesia with the patient or authorized representative who has indicated his/her understanding and acceptance.     Dental advisory given  Plan Discussed with: CRNA and Anesthesiologist  Anesthesia Plan Comments:         Anesthesia Quick Evaluation

## 2019-05-24 NOTE — Anesthesia Postprocedure Evaluation (Signed)
Anesthesia Post Note  Patient: Scott Cochran  Procedure(s) Performed: HERNIA REPAIR INGUINAL ADULT (Right )  Patient location during evaluation: PACU Anesthesia Type: General Level of consciousness: awake and alert Pain management: pain level controlled Vital Signs Assessment: post-procedure vital signs reviewed and stable Respiratory status: spontaneous breathing, nonlabored ventilation and respiratory function stable Cardiovascular status: blood pressure returned to baseline and stable Postop Assessment: no apparent nausea or vomiting Anesthetic complications: no     Last Vitals:  Vitals:   05/24/19 1210 05/24/19 1241  BP: (!) 151/92 (!) 144/86  Pulse: 61 63  Resp: 18 18  Temp: (!) 36.1 C (!) 36.1 C  SpO2: 97% 97%    Last Pain:  Vitals:   05/24/19 1241  TempSrc: Temporal  PainSc: 0-No pain                 Brett Canales Benton Tooker

## 2019-05-25 ENCOUNTER — Emergency Department: Payer: Medicare HMO

## 2019-05-25 ENCOUNTER — Observation Stay
Admission: EM | Admit: 2019-05-25 | Discharge: 2019-05-26 | Disposition: A | Discharge status: Home / Self Care | Payer: Medicare HMO | Attending: Internal Medicine | Admitting: Internal Medicine

## 2019-05-25 ENCOUNTER — Other Ambulatory Visit: Payer: Self-pay

## 2019-05-25 ENCOUNTER — Encounter: Payer: Self-pay | Admitting: Emergency Medicine

## 2019-05-25 ENCOUNTER — Observation Stay: Payer: Medicare HMO

## 2019-05-25 DIAGNOSIS — R29810 Facial weakness: Secondary | ICD-10-CM | POA: Insufficient documentation

## 2019-05-25 DIAGNOSIS — R930 Abnormal findings on diagnostic imaging of skull and head, not elsewhere classified: Secondary | ICD-10-CM | POA: Diagnosis not present

## 2019-05-25 DIAGNOSIS — Z87891 Personal history of nicotine dependence: Secondary | ICD-10-CM | POA: Diagnosis not present

## 2019-05-25 DIAGNOSIS — E785 Hyperlipidemia, unspecified: Secondary | ICD-10-CM | POA: Insufficient documentation

## 2019-05-25 DIAGNOSIS — I10 Essential (primary) hypertension: Secondary | ICD-10-CM | POA: Diagnosis not present

## 2019-05-25 DIAGNOSIS — I639 Cerebral infarction, unspecified: Secondary | ICD-10-CM | POA: Diagnosis not present

## 2019-05-25 DIAGNOSIS — D72829 Elevated white blood cell count, unspecified: Secondary | ICD-10-CM | POA: Diagnosis present

## 2019-05-25 DIAGNOSIS — R4781 Slurred speech: Secondary | ICD-10-CM | POA: Insufficient documentation

## 2019-05-25 DIAGNOSIS — F419 Anxiety disorder, unspecified: Secondary | ICD-10-CM | POA: Diagnosis present

## 2019-05-25 DIAGNOSIS — Z20822 Contact with and (suspected) exposure to covid-19: Secondary | ICD-10-CM | POA: Diagnosis not present

## 2019-05-25 DIAGNOSIS — Z79899 Other long term (current) drug therapy: Secondary | ICD-10-CM | POA: Diagnosis not present

## 2019-05-25 DIAGNOSIS — H9190 Unspecified hearing loss, unspecified ear: Secondary | ICD-10-CM | POA: Insufficient documentation

## 2019-05-25 DIAGNOSIS — G459 Transient cerebral ischemic attack, unspecified: Secondary | ICD-10-CM | POA: Diagnosis present

## 2019-05-25 DIAGNOSIS — I6782 Cerebral ischemia: Secondary | ICD-10-CM | POA: Insufficient documentation

## 2019-05-25 DIAGNOSIS — N179 Acute kidney failure, unspecified: Secondary | ICD-10-CM | POA: Diagnosis present

## 2019-05-25 DIAGNOSIS — N400 Enlarged prostate without lower urinary tract symptoms: Secondary | ICD-10-CM | POA: Insufficient documentation

## 2019-05-25 DIAGNOSIS — Z85828 Personal history of other malignant neoplasm of skin: Secondary | ICD-10-CM | POA: Diagnosis not present

## 2019-05-25 DIAGNOSIS — E7800 Pure hypercholesterolemia, unspecified: Secondary | ICD-10-CM | POA: Diagnosis present

## 2019-05-25 LAB — COMPREHENSIVE METABOLIC PANEL
ALT: 34 U/L (ref 0–44)
AST: 38 U/L (ref 15–41)
Albumin: 4.3 g/dL (ref 3.5–5.0)
Alkaline Phosphatase: 67 U/L (ref 38–126)
Anion gap: 8 (ref 5–15)
BUN: 16 mg/dL (ref 8–23)
CO2: 26 mmol/L (ref 22–32)
Calcium: 9.1 mg/dL (ref 8.9–10.3)
Chloride: 105 mmol/L (ref 98–111)
Creatinine, Ser: 1.53 mg/dL — ABNORMAL HIGH (ref 0.61–1.24)
GFR calc Af Amer: 52 mL/min — ABNORMAL LOW (ref >60)
GFR calc non Af Amer: 45 mL/min — ABNORMAL LOW (ref >60)
Glucose, Bld: 89 mg/dL (ref 70–99)
Potassium: 3.5 mmol/L (ref 3.5–5.1)
Sodium: 139 mmol/L (ref 135–145)
Total Bilirubin: 0.8 mg/dL (ref 0.3–1.2)
Total Protein: 7 g/dL (ref 6.5–8.1)

## 2019-05-25 LAB — CBC
HCT: 40.1 % (ref 39.0–52.0)
Hemoglobin: 13.3 g/dL (ref 13.0–17.0)
MCH: 30.8 pg (ref 26.0–34.0)
MCHC: 33.2 g/dL (ref 30.0–36.0)
MCV: 92.8 fL (ref 80.0–100.0)
Platelets: 167 10*3/uL (ref 150–400)
RBC: 4.32 MIL/uL (ref 4.22–5.81)
RDW: 14.7 % (ref 11.5–15.5)
WBC: 12.7 10*3/uL — ABNORMAL HIGH (ref 4.0–10.5)
nRBC: 0 % (ref 0.0–0.2)

## 2019-05-25 LAB — DIFFERENTIAL
Abs Immature Granulocytes: 0.06 10*3/uL (ref 0.00–0.07)
Basophils Absolute: 0 10*3/uL (ref 0.0–0.1)
Basophils Relative: 0 %
Eosinophils Absolute: 0 10*3/uL (ref 0.0–0.5)
Eosinophils Relative: 0 %
Immature Granulocytes: 1 %
Lymphocytes Relative: 15 %
Lymphs Abs: 2 10*3/uL (ref 0.7–4.0)
Monocytes Absolute: 0.6 10*3/uL (ref 0.1–1.0)
Monocytes Relative: 5 %
Neutro Abs: 10 10*3/uL — ABNORMAL HIGH (ref 1.7–7.7)
Neutrophils Relative %: 79 %

## 2019-05-25 LAB — GLUCOSE, CAPILLARY: Glucose-Capillary: 131 mg/dL — ABNORMAL HIGH (ref 70–99)

## 2019-05-25 LAB — PROTIME-INR
INR: 1 (ref 0.8–1.2)
Prothrombin Time: 12.6 seconds (ref 11.4–15.2)

## 2019-05-25 LAB — APTT: aPTT: 24 seconds (ref 24–36)

## 2019-05-25 LAB — SARS CORONAVIRUS 2 (TAT 6-24 HRS): SARS Coronavirus 2: NEGATIVE

## 2019-05-25 MED ORDER — ASPIRIN EC 81 MG PO TBEC
81.0000 mg | DELAYED_RELEASE_TABLET | Freq: Every day | ORAL | Status: DC
  Administered 2019-05-26: 81 mg via ORAL
  Filled 2019-05-25 (×2): qty 1

## 2019-05-25 MED ORDER — ENOXAPARIN SODIUM 40 MG/0.4ML ~~LOC~~ SOLN
40.0000 mg | SUBCUTANEOUS | Status: DC
  Administered 2019-05-25: 21:00:00 40 mg via SUBCUTANEOUS
  Filled 2019-05-25: qty 0.4

## 2019-05-25 MED ORDER — HYDRALAZINE HCL 20 MG/ML IJ SOLN
5.0000 mg | INTRAMUSCULAR | Status: DC | PRN
  Administered 2019-05-25: 19:00:00 5 mg via INTRAVENOUS
  Filled 2019-05-25 (×3): qty 0.25

## 2019-05-25 MED ORDER — ONDANSETRON HCL 4 MG/2ML IJ SOLN: 4.0000 mg | Freq: Three times a day (TID) | INTRAMUSCULAR | Status: DC | PRN

## 2019-05-25 MED ORDER — ACETAMINOPHEN 650 MG RE SUPP: 650.0000 mg | RECTAL | Status: DC | PRN

## 2019-05-25 MED ORDER — STROKE: EARLY STAGES OF RECOVERY BOOK: Freq: Once | Status: AC

## 2019-05-25 MED ORDER — DIAZEPAM 5 MG PO TABS: 5.0000 mg | ORAL_TABLET | Freq: Two times a day (BID) | ORAL | Status: DC | PRN

## 2019-05-25 MED ORDER — SIMVASTATIN 20 MG PO TABS
20.0000 mg | ORAL_TABLET | Freq: Every day | ORAL | Status: DC
  Administered 2019-05-26: 09:00:00 20 mg via ORAL
  Filled 2019-05-25 (×2): qty 1

## 2019-05-25 MED ORDER — LORATADINE 10 MG PO TABS
10.0000 mg | ORAL_TABLET | Freq: Every day | ORAL | Status: DC
  Administered 2019-05-26: 10 mg via ORAL
  Filled 2019-05-25: qty 1

## 2019-05-25 MED ORDER — ACETAMINOPHEN 160 MG/5ML PO SOLN
650.0000 mg | ORAL | Status: DC | PRN
  Filled 2019-05-25: qty 20.3

## 2019-05-25 MED ORDER — DOCUSATE SODIUM 100 MG PO CAPS: 100.0000 mg | ORAL_CAPSULE | Freq: Every day | ORAL | Status: DC | PRN

## 2019-05-25 MED ORDER — TAMSULOSIN HCL 0.4 MG PO CAPS
0.4000 mg | ORAL_CAPSULE | Freq: Every day | ORAL | Status: DC
  Administered 2019-05-25 – 2019-05-26 (×2): 0.4 mg via ORAL
  Filled 2019-05-25 (×3): qty 1

## 2019-05-25 MED ORDER — ACETAMINOPHEN 325 MG PO TABS: 650.0000 mg | ORAL_TABLET | ORAL | Status: DC | PRN

## 2019-05-25 MED ORDER — SODIUM CHLORIDE 0.9 % IV SOLN: INTRAVENOUS | Status: DC

## 2019-05-25 MED ORDER — BISOPROLOL FUMARATE 5 MG PO TABS
2.5000 mg | ORAL_TABLET | Freq: Every day | ORAL | Status: DC
  Administered 2019-05-26: 09:00:00 2.5 mg via ORAL
  Filled 2019-05-25 (×2): qty 0.5

## 2019-05-25 MED ORDER — ACETAMINOPHEN 325 MG PO TABS: 650.0000 mg | ORAL_TABLET | Freq: Four times a day (QID) | ORAL | Status: DC | PRN

## 2019-05-25 MED ORDER — HYDROCODONE-ACETAMINOPHEN 5-325 MG PO TABS
1.0000 | ORAL_TABLET | ORAL | Status: DC | PRN
  Administered 2019-05-25 – 2019-05-26 (×2): 1 via ORAL
  Filled 2019-05-25 (×2): qty 1

## 2019-05-25 NOTE — ED Notes (Signed)
Reports sxs started 830am, family noted slurred speech at 1043 when talking on the phone, right side facial droop also noted. Pt states he woke up around 6am normal ate breakfasts.  Pt took ASPRIN prior to arrival, unknown how much at this time

## 2019-05-25 NOTE — ED Notes (Signed)
Attempted to call report at this time. Unable to give report, floor staff to call this RN back

## 2019-05-25 NOTE — Consult Note (Signed)
Reason for Consult: dysarthria  Requesting Physician: Dr. Quentin Cornwall  CC: dysarthria R facial droop    HPI: Scott Cochran is an 69 y.o. male with hx of HN, BPH, hearing loss, HLD patients states went to sleep last night but unable to fall asleep. He took a sleeping pill at 1AM and got out of bed around 5 AM. He was with his family and around 8:45 when speaking on the phone he had slurry speech. Family also noted R facial droop.  Currently returned to baseline and NIHSS of 0. No anti platelet therapy prior to admission.   Past Medical History:  Diagnosis Date  . Anxiety   . BPH (benign prostatic hyperplasia)   . Fracture    left arm  . Hearing loss   . History of kidney stones   . Hx of basal cell carcinoma 02/23/2016   R infranasal medial to superior nasolabial  . Hypercholesterolemia   . Hypertension   . Meniere's disease     Past Surgical History:  Procedure Laterality Date  . COLONOSCOPY    . COLONOSCOPY WITH PROPOFOL N/A 05/24/2019   Procedure: COLONOSCOPY WITH PROPOFOL;  Surgeon: Robert Bellow, MD;  Location: [facility] ENDOSCOPY;  Service: Endoscopy;  Laterality: N/A;  TRAVEL CASE TO O.R.  . HEMORRHOID SURGERY    . HERNIA REPAIR Right   . INGUINAL HERNIA REPAIR Right 05/24/2019   Procedure: HERNIA REPAIR INGUINAL ADULT;  Surgeon: Robert Bellow, MD;  Location: [facility] ORS;  Service: General;  Laterality: Right;  patient will be coming from endo    History reviewed. No pertinent family history.  Social History:  reports that he has quit smoking. He has never used smokeless tobacco. He reports current alcohol use. He reports that he does not use drugs.  Allergies  Allergen Reactions  . Sulfa Antibiotics Other (See Comments)    unknown 25 years ago    Medications: I have reviewed the patient's current medications.  ROS: History obtained from the patient  General ROS: negative for - chills, fatigue, fever, night sweats, weight gain or weight loss Psychological ROS:  negative for - behavioral disorder, hallucinations, memory difficulties, mood swings or suicidal ideation Ophthalmic ROS: negative for - blurry vision, double vision, eye pain or loss of vision ENT ROS: negative for - epistaxis, nasal discharge, oral lesions, sore throat, tinnitus or vertigo Allergy and Immunology ROS: negative for - hives or itchy/watery eyes Hematological and Lymphatic ROS: negative for - bleeding problems, bruising or swollen lymph nodes Endocrine ROS: negative for - galactorrhea, hair pattern changes, polydipsia/polyuria or temperature intolerance Respiratory ROS: negative for - cough, hemoptysis, shortness of breath or wheezing Cardiovascular ROS: negative for - chest pain, dyspnea on exertion, edema or irregular heartbeat Gastrointestinal ROS: negative for - abdominal pain, diarrhea, hematemesis, nausea/vomiting or stool incontinence Genito-Urinary ROS: negative for - dysuria, hematuria, incontinence or urinary frequency/urgency Musculoskeletal ROS: negative for - joint swelling or muscular weakness Neurological ROS: as noted in HPI Dermatological ROS: negative for rash and skin lesion changes  Physical Examination: Blood pressure (!) 160/100, pulse (!) 53, temperature 97.7 F (36.5 C), temperature source Oral, resp. rate 20, height 5\' 9"  (1.753 m), weight 84.4 kg, SpO2 98 %.    Neurological Examination   Mental Status: Alert, oriented, thought content appropriate.  Speech fluent without evidence of aphasia.  Able to follow 3 step commands without difficulty. Cranial Nerves: II: Discs flat bilaterally; Visual fields grossly normal, pupils equal, round, reactive to light and accommodation III,IV, VI: ptosis  not present, extra-ocular motions intact bilaterally V,VII: smile symmetric, facial light touch sensation normal bilaterally VIII: hearing normal bilaterally IX,X: gag reflex present XI: bilateral shoulder shrug XII: midline tongue extension Motor: Right  : Upper extremity   5/5    Left:     Upper extremity   5/5  Lower extremity   5/5     Lower extremity   5/5 Tone and bulk:normal tone throughout; no atrophy noted Sensory: Pinprick and light touch intact throughout, bilaterally Deep Tendon Reflexes: 2+ and symmetric throughout Plantars: Right: downgoing   Left: downgoing Cerebellar: normal finger-to-nose, normal rapid alternating movements and normal heel-to-shin test Gait: not tested      Laboratory Studies:   Basic Metabolic Panel: No results for input(s): NA, K, CL, CO2, GLUCOSE, BUN, CREATININE, CALCIUM, MG, PHOS in the last 168 hours.  Liver Function Tests: No results for input(s): AST, ALT, ALKPHOS, BILITOT, PROT, ALBUMIN in the last 168 hours. No results for input(s): LIPASE, AMYLASE in the last 168 hours. No results for input(s): AMMONIA in the last 168 hours.  CBC: Recent Labs  Lab 05/25/19 1152  WBC 12.7*  NEUTROABS 10.0*  HGB 13.3  HCT 40.1  MCV 92.8  PLT 167    Cardiac Enzymes: No results for input(s): CKTOTAL, CKMB, CKMBINDEX, TROPONINI in the last 168 hours.  BNP: Invalid input(s): POCBNP  CBG: Recent Labs  Lab 05/25/19 1144  GLUCAP 131*    Microbiology: Results for orders placed or performed during the hospital encounter of 05/22/19  SARS CORONAVIRUS 2 (TAT 6-24 HRS) Nasopharyngeal Nasopharyngeal Swab     Status: None   Collection Time: 05/22/19  9:45 AM   Specimen: Nasopharyngeal Swab  Result Value Ref Range Status   SARS Coronavirus 2 NEGATIVE NEGATIVE Final    Comment: (NOTE) SARS-CoV-2 target nucleic acids are NOT DETECTED. The SARS-CoV-2 RNA is generally detectable in upper and lower respiratory specimens during the acute phase of infection. Negative results do not preclude SARS-CoV-2 infection, do not rule out co-infections with other pathogens, and should not be used as the sole basis for treatment or other patient management decisions. Negative results must be combined with clinical  observations, patient history, and epidemiological information. The expected result is Negative. Fact Sheet for Patients: SugarRoll.be Fact Sheet for Healthcare Providers: https://www.woods-mathews.com/ This test is not yet approved or cleared by the [place] FDA and  has been authorized for detection and/or diagnosis of SARS-CoV-2 by FDA under an Emergency Use Authorization (EUA). This EUA will remain  in effect (meaning this test can be used) for the duration of the COVID-19 declaration under Section 56 4(b)(1) of the Act, 21 U.S.C. section 360bbb-3(b)(1), unless the authorization is terminated or revoked sooner. Performed at [facility], [place] [address] [place], [place] [address]     Coagulation Studies: No results for input(s): LABPROT, INR in the last 72 hours.  Urinalysis: No results for input(s): COLORURINE, LABSPEC, PHURINE, GLUCOSEU, HGBUR, BILIRUBINUR, KETONESUR, PROTEINUR, UROBILINOGEN, NITRITE, LEUKOCYTESUR in the last 168 hours.  Invalid input(s): APPERANCEUR  Lipid Panel:  No results found for: CHOL, TRIG, HDL, CHOLHDL, VLDL, LDLCALC  HgbA1C: No results found for: HGBA1C  Urine Drug Screen:      Component Value Date/Time   LABOPIA NONE DETECTED 05/31/2016 1825   COCAINSCRNUR NONE DETECTED 05/31/2016 1825   LABBENZ NONE DETECTED 05/31/2016 1825   AMPHETMU NONE DETECTED 05/31/2016 1825   THCU NONE DETECTED 05/31/2016 1825   LABBARB NONE DETECTED 05/31/2016 1825    Alcohol Level:  No results for input(s): ETH in the last 168 hours.  Other results: EKG: normal EKG, normal sinus rhythm, unchanged from previous tracings.  Imaging: No results found.   Assessment/Plan:  69 y.o. male with hx of HN, BPH, hearing loss, HLD patients states went to sleep last night but unable to fall asleep. He took a sleeping pill at 1AM and got out of bed around 5 AM. He was with his family and around 8:45 when speaking on the  phone he had slurry speech. Family also noted R facial droop.  Currently returned to baseline and NIHSS of 0. No anti platelet therapy prior to admission.   - TIA work up - ASA 81 mg daily - carotid US -2decho - CtH no acute abnormalities - pt/ot - possibly d/c early AM after work up on ASA 81mg  and statin  - no TPA as back to baseline.  No need stat CTA same reason 05/25/2019, 12:27 PM

## 2019-05-25 NOTE — ED Provider Notes (Signed)
[facility] Emergency Department Provider Note    First MD Initiated Contact with Patient 05/25/19 1159     (approximate)  I have reviewed the triage vital signs and the nursing notes.   HISTORY  Chief Complaint Code Stroke    HPI VEIKKO Cochran is a 69 y.o. male below listed past medical history presents to the ER for evaluation of reported slurred speech and right facial droop that started this morning.  Last time he felt at baseline was at 8:00 this morning.  States he just had colonoscopy and recent procedures.  States he is also undergoing quite a bit of stress at home.  Denies any headache.  No pain.  Since arriving to the ER he feels that his symptoms are resolving.  Denies any blurry vision.  No numbness or tingling.    Past Medical History:  Diagnosis Date  . Anxiety   . BPH (benign prostatic hyperplasia)   . Fracture    left arm  . Hearing loss   . History of kidney stones   . Hx of basal cell carcinoma 02/23/2016   R infranasal medial to superior nasolabial  . Hypercholesterolemia   . Hypertension   . Meniere's disease    History reviewed. No pertinent family history. Past Surgical History:  Procedure Laterality Date  . COLONOSCOPY    . COLONOSCOPY WITH PROPOFOL N/A 05/24/2019   Procedure: COLONOSCOPY WITH PROPOFOL;  Surgeon: Robert Bellow, MD;  Location: [facility] ENDOSCOPY;  Service: Endoscopy;  Laterality: N/A;  TRAVEL CASE TO O.R.  . HEMORRHOID SURGERY    . HERNIA REPAIR Right   . INGUINAL HERNIA REPAIR Right 05/24/2019   Procedure: HERNIA REPAIR INGUINAL ADULT;  Surgeon: Robert Bellow, MD;  Location: [facility] ORS;  Service: General;  Laterality: Right;  patient will be coming from endo   Patient Active Problem List   Diagnosis Date Noted  . 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 Alcohol abuse 06/01/2016      Prior to Admission medications   Medication Sig Start Date End Date Taking?  Authorizing Provider  acetaminophen (TYLENOL) 500 MG tablet Take 1,000 mg by mouth every 6 (six) hours as needed for moderate pain.    [provider]  ascorbic acid (VITAMIN C) 500 MG tablet Take 500 mg by mouth 2 (two) times a week.    [provider]  bisoprolol-hydrochlorothiazide (ZIAC) 2.5-6.25 MG tablet Take 1 tablet by mouth daily. 02/18/19   [provider]  cetirizine (ZYRTEC) 5 MG tablet Take 5 mg by mouth daily.    [provider]  diazepam (VALIUM) 5 MG tablet Take 5 mg by mouth 2 (two) times daily as needed for anxiety. 11/22/18   [provider]  docusate sodium (COLACE) 100 MG capsule Take 100 mg by mouth daily.    [provider]  HYDROcodone-acetaminophen (NORCO/VICODIN) 5-325 MG tablet Take 1 tablet by mouth every 4 (four) hours as needed for moderate pain. 05/24/19 05/23/20  Robert Bellow, MD  mometasone (ELOCON) 0.1 % lotion Apply 1 application topically daily as needed (psoriasis).  12/13/18   [provider]  Multiple Vitamin (MULTIVITAMIN WITH MINERALS) TABS tablet Take 1 tablet by mouth daily.    [provider]  silodosin (RAPAFLO) 8 MG CAPS capsule Take 8 mg by mouth at bedtime. 04/02/19   [provider]  simvastatin (ZOCOR) 40 MG tablet Take 20 mg by mouth daily.  04/21/16   [provider]  vitamin B-12 (CYANOCOBALAMIN) 500 MCG tablet Take 500 mcg by mouth 2 (two) times a week.    [provider]    Allergies Sulfa antibiotics    Social History Social History   Tobacco Use  . Smoking status: Former Research scientist (life sciences)  . Smokeless tobacco: Never Used  . Tobacco comment: 35 years ago  Substance Use Topics  . Alcohol use: Yes    Comment: rarely  . Drug use: No    Review of Systems Patient denies headaches, rhinorrhea, blurry vision, numbness, shortness of breath, chest pain, edema, cough, abdominal pain, nausea, vomiting, diarrhea, dysuria, fevers, rashes or hallucinations  unless otherwise stated above in HPI. ____________________________________________   PHYSICAL EXAM:  VITAL SIGNS: Vitals:   05/25/19 1157  BP: (!) 160/100  Pulse: (!) 53  Resp: 20  Temp: 97.7 F (36.5 C)  SpO2: 98%    Constitutional: Alert and oriented.  Eyes: Conjunctivae are normal.  Head: Atraumatic. Nose: No congestion/rhinnorhea. Mouth/Throat: Mucous membranes are moist.   Neck: No stridor. Painless ROM.  Cardiovascular: Normal rate, regular rhythm. Grossly normal heart sounds.  Good peripheral circulation. Respiratory: Normal respiratory effort.  No retractions. Lungs CTAB. Gastrointestinal: Soft and nontender. No distention. No abdominal bruits. No CVA tenderness. Genitourinary:  Musculoskeletal: No lower extremity tenderness nor edema.  No joint effusions. Neurologic:  Trace right facial droop, fluent speech,  5/5 strenght throughout.  EOMI, no neglect.  Normal FNF. No gross focal neurologic deficits are appreciated. Skin:  Skin is warm, dry and intact. No rash noted. Psychiatric: Mood and affect are normal. Speech and behavior are normal.  ____________________________________________   LABS (all labs ordered are listed, but only abnormal results are displayed)  Results for orders placed or performed during the hospital encounter of 05/25/19 (from the past 24 hour(s))  Glucose, capillary     Status: Abnormal   Collection Time: 05/25/19 11:44 AM  Result Value Ref Range   Glucose-Capillary 131 (H) 70 - 99 mg/dL  Protime-INR     Status: None   Collection Time: 05/25/19 11:52 AM  Result Value Ref Range   Prothrombin Time 12.6 11.4 - 15.2 seconds   INR 1.0 0.8 - 1.2  APTT     Status: None   Collection Time: 05/25/19 11:52 AM  Result Value Ref Range   aPTT 24 24 - 36 seconds  CBC     Status: Abnormal   Collection Time: 05/25/19 11:52 AM  Result Value Ref Range   WBC 12.7 (H) 4.0 - 10.5 K/uL   RBC 4.32 4.22 - 5.81 MIL/uL   Hemoglobin 13.3 13.0 - 17.0 g/dL    HCT 40.1 39.0 - 52.0 %   MCV 92.8 80.0 - 100.0 fL   MCH 30.8 26.0 - 34.0 pg   MCHC 33.2 30.0 - 36.0 g/dL   RDW 14.7 11.5 - 15.5 %   Platelets 167 150 - 400 K/uL   nRBC 0.0 0.0 - 0.2 %  Differential     Status: Abnormal   Collection Time: 05/25/19 11:52 AM  Result Value Ref Range   Neutrophils Relative % 79 %   Neutro Abs 10.0 (H) 1.7 - 7.7 K/uL   Lymphocytes Relative 15 %   Lymphs Abs 2.0 0.7 - 4.0 K/uL   Monocytes Relative 5 %   Monocytes Absolute 0.6 0.1 - 1.0 K/uL   Eosinophils Relative 0 %   Eosinophils Absolute 0.0 0.0 - 0.5 K/uL   Basophils Relative 0 %   Basophils Absolute 0.0 0.0 -  0.1 K/uL   Immature Granulocytes 1 %   Abs Immature Granulocytes 0.06 0.00 - 0.07 K/uL  Comprehensive metabolic panel     Status: Abnormal   Collection Time: 05/25/19 11:52 AM  Result Value Ref Range   Sodium 139 135 - 145 mmol/L   Potassium 3.5 3.5 - 5.1 mmol/L   Chloride 105 98 - 111 mmol/L   CO2 26 22 - 32 mmol/L   Glucose, Bld 89 70 - 99 mg/dL   BUN 16 8 - 23 mg/dL   Creatinine, Ser 1.53 (H) 0.61 - 1.24 mg/dL   Calcium 9.1 8.9 - 10.3 mg/dL   Total Protein 7.0 6.5 - 8.1 g/dL   Albumin 4.3 3.5 - 5.0 g/dL   AST 38 15 - 41 U/L   ALT 34 0 - 44 U/L   Alkaline Phosphatase 67 38 - 126 U/L   Total Bilirubin 0.8 0.3 - 1.2 mg/dL   GFR calc non Af Amer 45 (L) >60 mL/min   GFR calc Af Amer 52 (L) >60 mL/min   Anion gap 8 5 - 15   ____________________________________________  EKG My review and personal interpretation at Time: 12:03   Indication: weakness  Rate: 70  Rhythm: sinus Axis: normal Other: normal intervals, no stemi ____________________________________________  RADIOLOGY  I personally reviewed all radiographic images ordered to evaluate for the above acute complaints and reviewed radiology reports and findings.  These findings were personally discussed with the patient.  Please see medical record for radiology report.  ____________________________________________    PROCEDURES  Procedure(s) performed:  Procedures    Critical Care performed: no ____________________________________________   INITIAL IMPRESSION / ASSESSMENT AND PLAN / ED COURSE  Pertinent labs & imaging results that were available during my care of the patient were reviewed by me and considered in my medical decision making (see chart for details).   DDX: cva, tia, hypoglycemia, dehydration, electrolyte abnormality, dissection, sepsis   DANELLE SANDSTROM is a 69 y.o. who presents to the ED with symptoms of right facial droop and slurred speech starting this morning at 8 AM.  Bedtime patient arrived to the ER symptoms are improving.  He was made code stroke in triage.  CT head shows no evidence of acute intracranial abnormality.  EKG nonischemic no signs of dysrhythmia.  Consulted neurology.  Clinical Course as of May 25 1254  Sat May 25, 2019  1208 Rechecked.  He continues to be stable.  Patient has been eval by neurology.  Agrees with plan for admission for TIA work-up.   [PR]    Clinical Course User Index [PR] Merlyn Lot, MD    The patient was evaluated in Emergency Department today for the symptoms described in the history of present illness. He/she was evaluated in the context of the global COVID-19 pandemic, which necessitated consideration that the patient might be at risk for infection with the SARS-CoV-2 virus that causes COVID-19. Institutional protocols and algorithms that pertain to the evaluation of patients at risk for COVID-19 are in a state of rapid change based on information released by regulatory bodies including the CDC and federal and state organizations. These policies and algorithms were followed during the patient's care in the ED.  As part of my medical decision making, I reviewed the following data within the [facility] notes reviewed and incorporated, Labs reviewed, notes from prior ED visits and [place] Controlled Substance Database    ____________________________________________   FINAL CLINICAL IMPRESSION(S) / ED DIAGNOSES  Final diagnoses:  TIA (transient ischemic attack)      NEW MEDICATIONS STARTED DURING THIS VISIT:  New Prescriptions   No medications on file     Note:  This document was prepared using Dragon voice recognition software and may include unintentional dictation errors.    Merlyn Lot, MD 05/25/19 1256

## 2019-05-25 NOTE — H&P (Signed)
History and Physical    Scott Cochran [id number] DOB: Jan 08, 1951 DOA: 05/25/2019  Referring MD/NP/PA:   PCP: Juluis Pitch, MD   Patient coming from:  The patient is coming from home.  At baseline, pt is independent for most of ADL.        Chief Complaint: Left facial droop and slurred speech  HPI: Scott Cochran is a 69 y.o. male with medical history significant of hypertension, hyperlipidemia, hard of hearing, BPH, kidney stone, former alcohol abuser in remission, who presents with facial droop and slurred speech.  Patient states he woke up normal at 6 AM, then developed mild right facial droop and slurred speech at about 8:30.  His symptoms resolved in the ED. No unilateral weakness or numbness in extremities.  No vision change.  Patient does not have chest pain, shortness breath, cough, fever or chills.  No nausea, vomiting, diarrhea, abdominal pain, symptoms of UTI.  Patient stated he had right inguinal hernia repair yesterday, no acute new issues after the surgery.   ED Course: pt was found to have WBC 12.7, INR 1.0, PTT 24, pending COVID-19 PCR, AKI with creatinine 1.53, BUN 16 (creatinine 1.0 on 01/14/2019), temperature normal, blood pressure 152/82, heart rate 57, oxygen saturation 98% on room air.  CT of head is negative for acute intracranial abnormalities.  Patient is placed on MedSurg bed for observation.  Neurologist, Dr. Irish Elders is consulted.   Review of Systems:   General: no fevers, chills, no body weight gain, has fatigue HEENT: no blurry vision, hearing changes or sore throat Respiratory: no dyspnea, coughing, wheezing CV: no chest pain, no palpitations GI: no nausea, vomiting, abdominal pain, diarrhea, constipation GU: no dysuria, burning on urination, increased urinary frequency, hematuria  Ext: no leg edema Neuro: no unilateral weakness, numbness, or tingling, no vision change. Has slurry speech and right facial droop Skin: no rash. MSK: No muscle spasm, no  deformity, no limitation of range of movement in spin Heme: No easy bruising.  Travel history: No recent long distant travel.  Allergy:  Allergies  Allergen Reactions  . Sulfa Antibiotics Other (See Comments)    unknown 25 years ago    Past Medical History:  Diagnosis Date  . Anxiety   . BPH (benign prostatic hyperplasia)   . Fracture    left arm  . Hearing loss   . History of kidney stones   . Hx of basal cell carcinoma 02/23/2016   R infranasal medial to superior nasolabial  . Hypercholesterolemia   . Hypertension   . Meniere's disease     Past Surgical History:  Procedure Laterality Date  . COLONOSCOPY    . COLONOSCOPY WITH PROPOFOL N/A 05/24/2019   Procedure: COLONOSCOPY WITH PROPOFOL;  Surgeon: Robert Bellow, MD;  Location: [facility] ENDOSCOPY;  Service: Endoscopy;  Laterality: N/A;  TRAVEL CASE TO O.R.  . HEMORRHOID SURGERY    . HERNIA REPAIR Right   . INGUINAL HERNIA REPAIR Right 05/24/2019   Procedure: HERNIA REPAIR INGUINAL ADULT;  Surgeon: Robert Bellow, MD;  Location: [facility] ORS;  Service: General;  Laterality: Right;  patient will be coming from endo    Social History:  reports that he has quit smoking. He has never used smokeless tobacco. He reports current alcohol use. He reports that he does not use drugs.  Family History:  Family History  Problem Relation Age of Onset  . Diabetes Mellitus II Father      Prior to Admission medications   Medication Sig  Start Date End Date Taking? Authorizing Provider  acetaminophen (TYLENOL) 500 MG tablet Take 1,000 mg by mouth every 6 (six) hours as needed for moderate pain.    [provider]  ascorbic acid (VITAMIN C) 500 MG tablet Take 500 mg by mouth 2 (two) times a week.    [provider]  bisoprolol-hydrochlorothiazide (ZIAC) 2.5-6.25 MG tablet Take 1 tablet by mouth daily. 02/18/19   [provider]  cetirizine (ZYRTEC) 5 MG tablet Take 5 mg by mouth daily.    [provider]    clobetasol (TEMOVATE) 0.05 % external solution Apply [id number] application topically 2 (two) times daily. 05/14/19   [provider]  diazepam (VALIUM) 5 MG tablet Take 5 mg by mouth 2 (two) times daily as needed for anxiety. 11/22/18   [provider]  docusate sodium (COLACE) 100 MG capsule Take 100 mg by mouth daily.    [provider]  HYDROcodone-acetaminophen (NORCO/VICODIN) 5-325 MG tablet Take 1 tablet by mouth every 4 (four) hours as needed for moderate pain. 05/24/19 05/23/20  Robert Bellow, MD  mometasone (ELOCON) 0.1 % lotion Apply 1 application topically daily as needed (psoriasis).  12/13/18   [provider]  Multiple Vitamin (MULTIVITAMIN WITH MINERALS) TABS tablet Take 1 tablet by mouth daily.    [provider]  silodosin (RAPAFLO) 8 MG CAPS capsule Take 8 mg by mouth at bedtime. 04/02/19   [provider]  simvastatin (ZOCOR) 40 MG tablet Take 20 mg by mouth daily.  04/21/16   [provider]  vitamin B-12 (CYANOCOBALAMIN) 500 MCG tablet Take 500 mcg by mouth 2 (two) times a week.    [provider]    Physical Exam: Vitals:   05/25/19 1200 05/25/19 1336 05/25/19 1500 05/25/19 1731  BP: (!) 152/82 (!) 177/95 (!) 172/87 (!) 165/90  Pulse: 70 65 (!) 57 (!) 55  Resp:   16 16  Temp:      TempSrc:      SpO2: 98% 98% 97% 97%  Weight:      Height:       General: Not in acute distress HEENT:       Eyes: PERRL, EOMI, no scleral icterus.       ENT: No discharge from the ears and nose, no pharynx injection, no tonsillar enlargement.        Neck: No JVD, no bruit, no mass felt. Heme: No neck lymph node enlargement. Cardiac: S1/S2, RRR, No murmurs, No gallops or rubs. Respiratory: No rales, wheezing, rhonchi or rubs. GI: Soft, nondistended, nontender, no rebound pain, no organomegaly, BS present. GU: No hematuria Ext: No pitting leg edema bilaterally. 2+DP/PT pulse bilaterally. Musculoskeletal: No joint  deformities, No joint redness or warmth, no limitation of ROM in spin. Skin: No rashes. S/p of right hernia repair, no draining in surgical site. Neuro: Alert, oriented X3, cranial nerves II-XII grossly intact, moves all extremities normally. Muscle strength 5/5 in all extremities, sensation to light touch intact. Psych: Patient is not psychotic, no suicidal or hemocidal ideation.  Labs on Admission: I have personally reviewed following labs and imaging studies  CBC: Recent Labs  Lab 05/25/19 1152  WBC 12.7*  NEUTROABS 10.0*  HGB 13.3  HCT 40.1  MCV 92.8  PLT [id number]   Basic Metabolic Panel: Recent Labs  Lab 05/25/19 1152  NA 139  K 3.5  CL 105  CO2 26  GLUCOSE 89  BUN 16  CREATININE 1.53*  CALCIUM 9.1   GFR:  Estimated Creatinine Clearance: 44.3 mL/min (A) (by C-G formula based on SCr of 1.53 mg/dL (H)). Liver Function Tests: Recent Labs  Lab 05/25/19 1152  AST 38  ALT 34  ALKPHOS 67  BILITOT 0.8  PROT 7.0  ALBUMIN 4.3   No results for input(s): LIPASE, AMYLASE in the last 168 hours. No results for input(s): AMMONIA in the last 168 hours. Coagulation Profile: Recent Labs  Lab 05/25/19 1152  INR 1.0   Cardiac Enzymes: No results for input(s): CKTOTAL, CKMB, CKMBINDEX, TROPONINI in the last 168 hours. BNP (last 3 results) No results for input(s): PROBNP in the last 8760 hours. HbA1C: No results for input(s): HGBA1C in the last 72 hours. CBG: Recent Labs  Lab 05/25/19 1144  GLUCAP 131*   Lipid Profile: No results for input(s): CHOL, HDL, LDLCALC, TRIG, CHOLHDL, LDLDIRECT in the last 72 hours. Thyroid Function Tests: No results for input(s): TSH, T4TOTAL, FREET4, T3FREE, THYROIDAB in the last 72 hours. Anemia Panel: No results for input(s): VITAMINB12, FOLATE, FERRITIN, TIBC, IRON, RETICCTPCT in the last 72 hours. Urine analysis: No results found for: COLORURINE, APPEARANCEUR, LABSPEC, PHURINE, GLUCOSEU, HGBUR, BILIRUBINUR, KETONESUR, PROTEINUR,  UROBILINOGEN, NITRITE, LEUKOCYTESUR Sepsis Labs: @LABRCNTIP (procalcitonin:4,lacticidven:4) ) Recent Results (from the past 240 hour(s))  SARS CORONAVIRUS 2 (TAT 6-24 HRS) Nasopharyngeal Nasopharyngeal Swab     Status: None   Collection Time: 05/22/19  9:45 AM   Specimen: Nasopharyngeal Swab  Result Value Ref Range Status   SARS Coronavirus 2 NEGATIVE NEGATIVE Final    Comment: (NOTE) SARS-CoV-2 target nucleic acids are NOT DETECTED. The SARS-CoV-2 RNA is generally detectable in upper and lower respiratory specimens during the acute phase of infection. Negative results do not preclude SARS-CoV-2 infection, do not rule out co-infections with other pathogens, and should not be used as the sole basis for treatment or other patient management decisions. Negative results must be combined with clinical observations, patient history, and epidemiological information. The expected result is Negative. Fact Sheet for Patients: SugarRoll.be Fact Sheet for Healthcare Providers: https://www.woods-mathews.com/ This test is not yet approved or cleared by the [place] FDA and  has been authorized for detection and/or diagnosis of SARS-CoV-2 by FDA under an Emergency Use Authorization (EUA). This EUA will remain  in effect (meaning this test can be used) for the duration of the COVID-19 declaration under Section 56 4(b)(1) of the Act, 21 U.S.C. section 360bbb-3(b)(1), unless the authorization is terminated or revoked sooner. Performed at [facility], [place] [address], [place], [place] [address]      Radiological Exams on Admission: CT HEAD CODE STROKE WO CONTRAST  Result Date: 05/25/2019 CLINICAL DATA:  Code stroke.  Right facial droop EXAM: CT HEAD WITHOUT CONTRAST TECHNIQUE: Contiguous axial images were obtained from the base of the skull through the vertex without intravenous contrast. COMPARISON:  None. FINDINGS: Brain: There is no acute  intracranial hemorrhage, mass effect, or edema. Gray-white differentiation is preserved. Patchy in confluent hypoattenuation in the supratentorial white matter is nonspecific but may reflect moderate chronic microvascular ischemic changes. Prominence of the ventricles and sulci reflects minor generalized parenchymal volume loss. There is no extra-axial fluid collection. Vascular: No hyperdense vessel. There is mild intracranial atherosclerotic calcification at the skull base. Skull: Unremarkable Sinuses/Orbits: Opacification of the right frontal, anterior ethmoid, and visualized right maxillary sinuses. Soft tissue extends into the nasal cavity. Other: Mastoid air cells are clear. There is evidence of prior left partial mastoidectomy. ASPECTS [facility] Stroke Program Early CT Score) - Ganglionic level infarction (caudate, lentiform  nuclei, internal capsule, insula, M1-M3 cortex): 7 - Supraganglionic infarction (M4-M6 cortex): 3 Total score (0-10 with 10 being normal): 10 IMPRESSION: No acute intracranial hemorrhage or evidence of acute infarction. ASPECT score is 10 Moderate chronic microvascular ischemic changes. Superimposed recent small vessel infarct is difficult to exclude without comparison studies. Right maxillary, ethmoid, and frontal sinus opacification with soft tissue extending into the nasal cavity. An obstructing lesion such as a polyp is not excluded. Suggest nonemergent direct visual inspection. Initial results were called by telephone at the time of interpretation on 05/25/2019 at 11:59 am to provider Merlyn Lot , who verbally acknowledged these results. Electronically Signed   By: Macy Mis M.D.   On: 05/25/2019 12:04     EKG: Independently reviewed.  Sinus rhythm, regular, QTC 450, no ischemic change  Assessment/Plan Principal Problem:   TIA (transient ischemic attack) Active Problems:   Hypertension   Hypercholesterolemia   Anxiety   Leukocytosis   AKI (acute kidney injury)  ([place])   TIA (transient ischemic attack): Patient presents with transient right facial droop and slurred speech.  CT head is negative for acute intracranial abnormalities.  Neurology, Dr. Irish Elders recommended TIA work-up.  - Placed on MedSurg bed for observation - Highly appreciated neurologist's consultation,will follow up recommendations - Obtain MRI/MRA  - Check carotid dopplers  - Continue  ASA and zocor - fasting lipid panel and HbA1c  - 2D transthoracic echocardiography  - swallowing screen. If fails, will get SLP - PT/OT consult  Hypertension: -switch Ziac to bisoprolol and hold HCTZ due to AKI -prn hydralazine  Hypercholesterolemia -zocor  Anxiety -continue home Valium  Leukocytosis: no fever, no signs of infection.  Likely reactive -Follow-up with CBC  AKI (acute kidney injury) ([place]): Likely due to dehydration and continuation of HCTZ and naproxen -Hold HCTZ and naproxen -IV fluid: 75 cc normal saline per hour -Follow-up by BMP   Abnormal finding on CT-head: CT showed right maxillary, ethmoid, and frontal sinus opacification with soft tissue extending into the nasal cavity. An obstructing lesion such as a polyp is not excluded. Suggest nonemergent direct visual inspection. -may need to give referral to ENT     DVT ppx:   SQ Lovenox Code Status: Full code Family Communication:   Yes, patient's niece at bed side Disposition Plan:  Anticipate discharge back to previous home environment Consults called: Dr. Irish Elders for neurology Admission status: Med-surg bed for obs  Date of Service 05/25/2019    [facility]   If 7PM-7AM, please contact night-coverage [email or web address] 05/25/2019, 5:53 PM

## 2019-05-25 NOTE — Consult Note (Signed)
CODE STROKE- PHARMACY COMMUNICATION   Time CODE STROKE called/page received: 1148  Time response to CODE STROKE was made (in person or via phone): 1152  Time Stroke Kit retrieved from [place] (only if needed): 1155 (based on questionable last known well - after interview by provider, determined unneeded - returned to pyxis)  Name of Provider/Nurse contacted: Quentin Cornwall  Past Medical History:  Diagnosis Date  . Anxiety   . BPH (benign prostatic hyperplasia)   . Fracture    left arm  . Hearing loss   . History of kidney stones   . Hx of basal cell carcinoma 02/23/2016   R infranasal medial to superior nasolabial  . Hypercholesterolemia   . Hypertension   . Meniere's disease    Prior to Admission medications   Medication Sig Start Date End Date Taking? Authorizing Provider  acetaminophen (TYLENOL) 500 MG tablet Take 1,000 mg by mouth every 6 (six) hours as needed for moderate pain.    [provider]  ascorbic acid (VITAMIN C) 500 MG tablet Take 500 mg by mouth 2 (two) times a week.    [provider]  bisoprolol-hydrochlorothiazide (ZIAC) 2.5-6.25 MG tablet Take 1 tablet by mouth daily. 02/18/19   [provider]  cetirizine (ZYRTEC) 5 MG tablet Take 5 mg by mouth daily.    [provider]  diazepam (VALIUM) 5 MG tablet Take 5 mg by mouth 2 (two) times daily as needed for anxiety. 11/22/18   [provider]  docusate sodium (COLACE) 100 MG capsule Take 100 mg by mouth daily.    [provider]  HYDROcodone-acetaminophen (NORCO/VICODIN) 5-325 MG tablet Take 1 tablet by mouth every 4 (four) hours as needed for moderate pain. 05/24/19 05/23/20  Robert Bellow, MD  mometasone (ELOCON) 0.1 % lotion Apply 1 application topically daily as needed (psoriasis).  12/13/18   [provider]  Multiple Vitamin (MULTIVITAMIN WITH MINERALS) TABS tablet Take 1 tablet by mouth daily.    [provider]  silodosin (RAPAFLO) 8 MG CAPS  capsule Take 8 mg by mouth at bedtime. 04/02/19   [provider]  simvastatin (ZOCOR) 40 MG tablet Take 20 mg by mouth daily.  04/21/16   [provider]  vitamin B-12 (CYANOCOBALAMIN) 500 MCG tablet Take 500 mcg by mouth 2 (two) times a week.    [provider]    Lu Duffel ,PharmD Clinical Pharmacist  05/25/2019  12:02 PM

## 2019-05-25 NOTE — ED Triage Notes (Signed)
Pt presents to ED via POV, pt states this morning awoke at approx 0600 and was perfectly fine. Upon arrival to ED pt with noted mild slurred speech in triage and R sided facial droop. Pt states symptoms started at 0830 this morning. Pt with no drift, equal bilateral grip strengths upon arrival. Code stroke initiated and patient transported to CT by this RN.

## 2019-05-26 ENCOUNTER — Observation Stay: Payer: Medicare HMO

## 2019-05-26 ENCOUNTER — Observation Stay
Admit: 2019-05-26 | Discharge: 2019-05-26 | Disposition: A | Discharge status: Home / Self Care | Payer: Medicare HMO | Attending: Internal Medicine | Admitting: Internal Medicine

## 2019-05-26 DIAGNOSIS — I10 Essential (primary) hypertension: Secondary | ICD-10-CM | POA: Diagnosis not present

## 2019-05-26 DIAGNOSIS — N179 Acute kidney failure, unspecified: Secondary | ICD-10-CM | POA: Diagnosis not present

## 2019-05-26 DIAGNOSIS — G459 Transient cerebral ischemic attack, unspecified: Secondary | ICD-10-CM | POA: Diagnosis not present

## 2019-05-26 DIAGNOSIS — E7800 Pure hypercholesterolemia, unspecified: Secondary | ICD-10-CM | POA: Diagnosis not present

## 2019-05-26 LAB — BASIC METABOLIC PANEL
Anion gap: 5 (ref 5–15)
BUN: 20 mg/dL (ref 8–23)
CO2: 26 mmol/L (ref 22–32)
Calcium: 8.7 mg/dL — ABNORMAL LOW (ref 8.9–10.3)
Chloride: 108 mmol/L (ref 98–111)
Creatinine, Ser: 1.12 mg/dL (ref 0.61–1.24)
GFR calc Af Amer: >60 mL/min (ref >60)
GFR calc non Af Amer: >60 mL/min (ref >60)
Glucose, Bld: 100 mg/dL — ABNORMAL HIGH (ref 70–99)
Potassium: 4.8 mmol/L (ref 3.5–5.1)
Sodium: 139 mmol/L (ref 135–145)

## 2019-05-26 LAB — LIPID PANEL
Cholesterol: 156 mg/dL (ref 0–200)
HDL: 39 mg/dL — ABNORMAL LOW (ref >40)
LDL Cholesterol: 94 mg/dL (ref 0–99)
Total CHOL/HDL Ratio: 4 RATIO
Triglycerides: 117 mg/dL (ref <150)
VLDL: 23 mg/dL (ref 0–40)

## 2019-05-26 MED ORDER — ASPIRIN 81 MG PO TBEC: 81.0000 mg | DELAYED_RELEASE_TABLET | Freq: Every day | ORAL | 1 refills | Status: AC

## 2019-05-26 NOTE — Plan of Care (Signed)
Pt currently does not have any deficits. Pt ambulates independently to the bathroom. No signs of distress noted. Will continue to monitor.

## 2019-05-26 NOTE — Progress Notes (Signed)
Discharge note: Echo completed. MD notified. Per MD, pt okay to d/c. Reviewed d/c instructions/med list. Pt. Verbalized understanding.  Right inguinal hernia repair dressing intact/dry. Obtained vitals. Staff wheeled pt out.  Pt. Transported to home via private vehicle.

## 2019-05-26 NOTE — Discharge Summary (Addendum)
Physician Discharge Summary  Scott Cochran [id number] DOB: 1950-07-21 DOA: 05/25/2019  PCP: Juluis Pitch, MD  Admit date: 05/25/2019 Discharge date: 05/26/2019  Admitted From: home Disposition:  home  Recommendations for Outpatient Follow-up:  1. Follow up with PCP in 1-2 weeks, follow-up final echocardiogram results as an outpatient 2. Patient started on daily aspirin on discharge  Home Health: None Equipment/Devices: None  Discharge Condition: Stable CODE STATUS: Full code Diet recommendation: Heart healthy  HPI: Per admitting MD, Scott Cochran is a 69 y.o. male with medical history significant of hypertension, hyperlipidemia, hard of hearing, BPH, kidney stone, former alcohol abuser in remission, who presents with facial droop and slurred speech. Patient states he woke up normal at 6 AM, then developed mild right facial droop and slurred speech at about 8:30.  His symptoms resolved in the ED. No unilateral weakness or numbness in extremities.  No vision change.  Patient does not have chest pain, shortness breath, cough, fever or chills.  No nausea, vomiting, diarrhea, abdominal pain, symptoms of UTI.  Patient stated he had right inguinal hernia repair yesterday, no acute new issues after the surgery. ED Course: pt was found to have WBC 12.7, INR 1.0, PTT 24, pending COVID-19 PCR, AKI with creatinine 1.53, BUN 16 (creatinine 1.0 on 01/14/2019), temperature normal, blood pressure 152/82, heart rate 57, oxygen saturation 98% on room air.  CT of head is negative for acute intracranial abnormalities.  Patient is placed on MedSurg bed for observation.  Neurologist, Dr. Irish Elders is consulted.  Hospital Course / Discharge diagnoses: CVA-patient presented to the hospital with transient right facial droop and slurred speech.  Neurology consulted and followed patient while hospitalized.  CT head was negative for acute intracranial abnormalities.  He underwent an MRI of the brain which showed  acute on chronic small vessel ischemia in the left corona radiata with a small acute lacunar infarct with no associated hemorrhage or mass-effect.  Neurology recommended aspirin along with continuing his statin for hyperlipidemia.  Hemoglobin A1c pending at the time of discharge.  Lipid panel showed an LDL of 94. Patient's symptoms have resolved, he is back to baseline and will be discharged home in stable condition. Hypertension -resume home medications on discharge Hypercholesterolemia -on statin Anxiety -continue home medications Leukocytosis -no fever, no signs of infection.  Likely reactive AKI (acute kidney injury) versus CKD stage III -Likely due to dehydration, has good p.o. intake now.  Received IV fluids and his Cr normalized at 1.12. Abnormal finding on CT-head: CT showed right maxillary, ethmoid, and frontal sinus opacification with soft tissue extending into the nasal cavity. An obstructing lesion such as a polyp is not excluded. Suggest nonemergent direct visual inspection.  Recommend ENT referral as an outpatient  Discharge Instructions   Allergies as of 05/26/2019      Reactions   Sulfa Antibiotics Other (See Comments)   unknown 25 years ago      Medication List    TAKE these medications   acetaminophen 500 MG tablet Commonly known as: TYLENOL Take 1,000 mg by mouth every 6 (six) hours as needed for moderate pain.   aspirin 81 MG EC tablet Take 1 tablet (81 mg total) by mouth daily. Start taking on: May 27, 2019 What changed:   when to take this  reasons to take this  additional instructions   bisoprolol-hydrochlorothiazide 2.5-6.25 MG tablet Commonly known as: ZIAC Take 1 tablet by mouth daily.   calcipotriene 0.005 % cream Commonly known as: DOVONOX Apply  0.00005 mcg topically in the morning and at bedtime.   cetirizine 5 MG tablet Commonly known as: ZYRTEC Take 5 mg by mouth daily.   clobetasol 0.05 % external solution Commonly known as:  TEMOVATE Apply [id number] application topically 2 (two) times daily.   diazepam 5 MG tablet Commonly known as: VALIUM Take 5 mg by mouth 2 (two) times daily as needed for anxiety.   docusate sodium 100 MG capsule Commonly known as: COLACE Take 100 mg by mouth daily.   fluorouracil 5 % cream Commonly known as: EFUDEX Apply 0.005 mcg topically in the morning and at bedtime.   HYDROcodone-acetaminophen 5-325 MG tablet Commonly known as: NORCO/VICODIN Take 1 tablet by mouth every 4 (four) hours as needed for moderate pain.   mometasone 0.1 % lotion Commonly known as: ELOCON Apply 1 application topically daily as needed (psoriasis).   naproxen sodium 220 MG tablet Commonly known as: ALEVE Take 220 mg by mouth daily as needed.   simvastatin 20 MG tablet Commonly known as: ZOCOR Take 20 mg by mouth daily.   tamsulosin 0.4 MG Caps capsule Commonly known as: FLOMAX Take 0.4 mg by mouth daily.   triamcinolone cream 0.1 % Commonly known as: KENALOG Apply 1 application topically 2 (two) times daily.        Consultations:  Neurology   Procedures/Studies:  2D echo - pending  MR ANGIO HEAD WO CONTRAST  Result Date: 05/25/2019 CLINICAL DATA:  69 year old male code stroke presentation, right facial droop. EXAM: MRI HEAD WITHOUT CONTRAST MRA HEAD WITHOUT CONTRAST TECHNIQUE: Multiplanar, multiecho pulse sequences of the brain and surrounding structures were obtained without intravenous contrast. Angiographic images of the head were obtained using MRA technique without contrast. COMPARISON:  Head CT, brain MRI today. FINDINGS: MRI HEAD FINDINGS Brain: Small 4-5 mm focus of restricted diffusion in the left corona radiata, located posterior to a nearby chronic lacunar infarct. Minimal T2 and FLAIR hyperintensity at the acute site with no hemorrhage or mass effect. No other restricted diffusion. Left greater than right chronic more anterior corona radiata white matter infarcts. Patchy  bilateral periatrial white matter T2 and FLAIR hyperintensity. No cortical encephalomalacia. No chronic cerebral blood products. No midline shift, mass effect, evidence of mass lesion, ventriculomegaly, extra-axial collection or acute intracranial hemorrhage. Cervicomedullary junction and pituitary are within normal limits. Vascular: Major intracranial vascular flow voids are preserved, see MRA findings below. Skull and upper cervical spine: Normal visible cervical spine and bone marrow signal. Sinuses/Orbits: Negative orbits. Right side paranasal sinus disease in an [facility] obstructive pattern. There is a small 18 mm area of indistinct T2 hypointensity at the right maxillary antrum which appears only slightly heterogeneous on diffusion, and has some intrinsic T1 hyperintensity. Other: Mastoids are clear. Visible internal auditory structures appear normal. Scalp and face soft tissues appear negative. MRA HEAD FINDINGS Antegrade flow in the posterior circulation with mildly dominant appearing distal right vertebral artery. No distal vertebral stenosis. The left PICA origin is patent and the right AICA appears dominant. Patent vertebrobasilar junction and basilar artery without stenosis. Fetal type left PCA origin. Normal SCA and right PCA origins. Diminutive or absent right posterior communicating artery. Bilateral PCA branches are within normal limits. Antegrade flow in both ICA siphons. No siphon stenosis. Normal ophthalmic and left posterior communicating artery origins. Patent carotid termini. Normal MCA and ACA origins. Anterior communicating artery, visible bilateral ACA branches and visible bilateral MCA branches are within normal limits. IMPRESSION: 1. Acute on chronic small vessel ischemia in the left  corona radiata. Small acute lacunar infarct with no associated hemorrhage or mass effect. 2. Negative intracranial MRA. 3. Right [facility] obstructive pattern of paranasal sinus disease with an indeterminate 18 mm T2  hypointense lesion obstructing the right maxillary antrum. Intrinsic T1 hyperintensity of the lesion favors inspissated secretions. But recommend ENT follow-up to evaluate further. Electronically Signed   By: Genevie Ann M.D.   On: 05/25/2019 20:14   MR BRAIN WO CONTRAST  Result Date: 05/25/2019 CLINICAL DATA:  69 year old male code stroke presentation, right facial droop. EXAM: MRI HEAD WITHOUT CONTRAST MRA HEAD WITHOUT CONTRAST TECHNIQUE: Multiplanar, multiecho pulse sequences of the brain and surrounding structures were obtained without intravenous contrast. Angiographic images of the head were obtained using MRA technique without contrast. COMPARISON:  Head CT, brain MRI today. FINDINGS: MRI HEAD FINDINGS Brain: Small 4-5 mm focus of restricted diffusion in the left corona radiata, located posterior to a nearby chronic lacunar infarct. Minimal T2 and FLAIR hyperintensity at the acute site with no hemorrhage or mass effect. No other restricted diffusion. Left greater than right chronic more anterior corona radiata white matter infarcts. Patchy bilateral periatrial white matter T2 and FLAIR hyperintensity. No cortical encephalomalacia. No chronic cerebral blood products. No midline shift, mass effect, evidence of mass lesion, ventriculomegaly, extra-axial collection or acute intracranial hemorrhage. Cervicomedullary junction and pituitary are within normal limits. Vascular: Major intracranial vascular flow voids are preserved, see MRA findings below. Skull and upper cervical spine: Normal visible cervical spine and bone marrow signal. Sinuses/Orbits: Negative orbits. Right side paranasal sinus disease in an [facility] obstructive pattern. There is a small 18 mm area of indistinct T2 hypointensity at the right maxillary antrum which appears only slightly heterogeneous on diffusion, and has some intrinsic T1 hyperintensity. Other: Mastoids are clear. Visible internal auditory structures appear normal. Scalp and face soft  tissues appear negative. MRA HEAD FINDINGS Antegrade flow in the posterior circulation with mildly dominant appearing distal right vertebral artery. No distal vertebral stenosis. The left PICA origin is patent and the right AICA appears dominant. Patent vertebrobasilar junction and basilar artery without stenosis. Fetal type left PCA origin. Normal SCA and right PCA origins. Diminutive or absent right posterior communicating artery. Bilateral PCA branches are within normal limits. Antegrade flow in both ICA siphons. No siphon stenosis. Normal ophthalmic and left posterior communicating artery origins. Patent carotid termini. Normal MCA and ACA origins. Anterior communicating artery, visible bilateral ACA branches and visible bilateral MCA branches are within normal limits. IMPRESSION: 1. Acute on chronic small vessel ischemia in the left corona radiata. Small acute lacunar infarct with no associated hemorrhage or mass effect. 2. Negative intracranial MRA. 3. Right [facility] obstructive pattern of paranasal sinus disease with an indeterminate 18 mm T2 hypointense lesion obstructing the right maxillary antrum. Intrinsic T1 hyperintensity of the lesion favors inspissated secretions. But recommend ENT follow-up to evaluate further. Electronically Signed   By: Genevie Ann M.D.   On: 05/25/2019 20:14   US Carotid Bilateral (at [facility] and [facility] only)  Result Date: 05/26/2019 CLINICAL DATA:  TIA, left facial droop and slurred speech. Hypertension, hyperlipidemia, previous tobacco abuse EXAM: BILATERAL CAROTID DUPLEX ULTRASOUND TECHNIQUE: Pearline Cables scale imaging, color Doppler and duplex ultrasound were performed of bilateral carotid and vertebral arteries in the neck. COMPARISON:  None. FINDINGS: Criteria: Quantification of carotid stenosis is based on velocity parameters that correlate the residual internal carotid diameter with NASCET-based stenosis levels, using the diameter of the distal internal carotid lumen as the denominator for  stenosis measurement. The  following velocity measurements were obtained: RIGHT ICA: 80/31 cm/sec CCA: [id number] cm/sec SYSTOLIC ICA/CCA RATIO:  0.7 ECA: 89 cm/sec LEFT ICA: 87/32 cm/sec CCA: [id number] cm/sec SYSTOLIC ICA/CCA RATIO:  1.1 ECA: 82 cm/sec RIGHT CAROTID ARTERY: Calcified eccentric plaque in the bulb and proximal ICA with minimal stenosis. Normal waveforms and color Doppler signal throughout. Minimal tortuosity. RIGHT VERTEBRAL ARTERY:  Normal flow direction and waveform. LEFT CAROTID ARTERY: Partially calcified eccentric somewhat irregular plaque in the bulb and proximal ICA without high-grade stenosis. Normal waveforms and color Doppler signal. LEFT VERTEBRAL ARTERY:  Normal flow direction and waveform. IMPRESSION: 1. Bilateral carotid bifurcation plaque resulting in less than 50% diameter ICA stenosis. 2. Antegrade bilateral vertebral arterial flow. Electronically Signed   By: Lucrezia Europe M.D.   On: 05/26/2019 08:45   CT HEAD CODE STROKE WO CONTRAST  Result Date: 05/25/2019 CLINICAL DATA:  Code stroke.  Right facial droop EXAM: CT HEAD WITHOUT CONTRAST TECHNIQUE: Contiguous axial images were obtained from the base of the skull through the vertex without intravenous contrast. COMPARISON:  None. FINDINGS: Brain: There is no acute intracranial hemorrhage, mass effect, or edema. Gray-white differentiation is preserved. Patchy in confluent hypoattenuation in the supratentorial white matter is nonspecific but may reflect moderate chronic microvascular ischemic changes. Prominence of the ventricles and sulci reflects minor generalized parenchymal volume loss. There is no extra-axial fluid collection. Vascular: No hyperdense vessel. There is mild intracranial atherosclerotic calcification at the skull base. Skull: Unremarkable Sinuses/Orbits: Opacification of the right frontal, anterior ethmoid, and visualized right maxillary sinuses. Soft tissue extends into the nasal cavity. Other: Mastoid air cells are clear.  There is evidence of prior left partial mastoidectomy. ASPECTS ([place] Stroke Program Early CT Score) - Ganglionic level infarction (caudate, lentiform nuclei, internal capsule, insula, M1-M3 cortex): 7 - Supraganglionic infarction (M4-M6 cortex): 3 Total score (0-10 with 10 being normal): 10 IMPRESSION: No acute intracranial hemorrhage or evidence of acute infarction. ASPECT score is 10 Moderate chronic microvascular ischemic changes. Superimposed recent small vessel infarct is difficult to exclude without comparison studies. Right maxillary, ethmoid, and frontal sinus opacification with soft tissue extending into the nasal cavity. An obstructing lesion such as a polyp is not excluded. Suggest nonemergent direct visual inspection. Initial results were called by telephone at the time of interpretation on 05/25/2019 at 11:59 am to provider Merlyn Lot , who verbally acknowledged these results. Electronically Signed   By: Macy Mis M.D.   On: 05/25/2019 12:04      Subjective: - no chest pain, shortness of breath, no abdominal pain, nausea or vomiting. Asking to go home  Discharge Exam: BP 129/84 (BP Location: Right Arm)   Pulse (!) 58   Temp 97.8 F (36.6 C) (Oral)   Resp 16   Ht 5\' 9"  (1.753 m)   Wt 84.4 kg   SpO2 96%   BMI 27.47 kg/m   General: Pt is alert, awake, not in acute distress Cardiovascular: RRR, S1/S2 +, no rubs, no gallops Respiratory: CTA bilaterally, no wheezing, no rhonchi Abdominal: Soft, NT, ND, bowel sounds + Extremities: no edema, no cyanosis    The results of significant diagnostics from this hospitalization (including imaging, microbiology, ancillary and laboratory) are listed below for reference.     Microbiology: Recent Results (from the past 240 hour(s))  SARS CORONAVIRUS 2 (TAT 6-24 HRS) Nasopharyngeal Nasopharyngeal Swab     Status: None   Collection Time: 05/22/19  9:45 AM   Specimen: Nasopharyngeal Swab  Result Value Ref Range Status  SARS  Coronavirus 2 NEGATIVE NEGATIVE Final    Comment: (NOTE) SARS-CoV-2 target nucleic acids are NOT DETECTED. The SARS-CoV-2 RNA is generally detectable in upper and lower respiratory specimens during the acute phase of infection. Negative results do not preclude SARS-CoV-2 infection, do not rule out co-infections with other pathogens, and should not be used as the sole basis for treatment or other patient management decisions. Negative results must be combined with clinical observations, patient history, and epidemiological information. The expected result is Negative. Fact Sheet for Patients: SugarRoll.be Fact Sheet for Healthcare Providers: https://www.woods-mathews.com/ This test is not yet approved or cleared by the [place] FDA and  has been authorized for detection and/or diagnosis of SARS-CoV-2 by FDA under an Emergency Use Authorization (EUA). This EUA will remain  in effect (meaning this test can be used) for the duration of the COVID-19 declaration under Section 56 4(b)(1) of the Act, 21 U.S.C. section 360bbb-3(b)(1), unless the authorization is terminated or revoked sooner. Performed at [facility], [place] [address], [place], [place] [address]   SARS CORONAVIRUS 2 (TAT 6-24 HRS) Nasopharyngeal Nasopharyngeal Swab     Status: None   Collection Time: 05/25/19 12:31 PM   Specimen: Nasopharyngeal Swab  Result Value Ref Range Status   SARS Coronavirus 2 NEGATIVE NEGATIVE Final    Comment: (NOTE) SARS-CoV-2 target nucleic acids are NOT DETECTED. The SARS-CoV-2 RNA is generally detectable in upper and lower respiratory specimens during the acute phase of infection. Negative results do not preclude SARS-CoV-2 infection, do not rule out co-infections with other pathogens, and should not be used as the sole basis for treatment or other patient management decisions. Negative results must be combined with clinical  observations, patient history, and epidemiological information. The expected result is Negative. Fact Sheet for Patients: SugarRoll.be Fact Sheet for Healthcare Providers: https://www.woods-mathews.com/ This test is not yet approved or cleared by the [place] FDA and  has been authorized for detection and/or diagnosis of SARS-CoV-2 by FDA under an Emergency Use Authorization (EUA). This EUA will remain  in effect (meaning this test can be used) for the duration of the COVID-19 declaration under Section 56 4(b)(1) of the Act, 21 U.S.C. section 360bbb-3(b)(1), unless the authorization is terminated or revoked sooner. Performed at [facility], [place] [address] [place], [place] [address]      Labs: Basic Metabolic Panel: Recent Labs  Lab 05/25/19 1152  NA 139  K 3.5  CL 105  CO2 26  GLUCOSE 89  BUN 16  CREATININE 1.53*  CALCIUM 9.1   Liver Function Tests: Recent Labs  Lab 05/25/19 1152  AST 38  ALT 34  ALKPHOS 67  BILITOT 0.8  PROT 7.0  ALBUMIN 4.3   CBC: Recent Labs  Lab 05/25/19 1152  WBC 12.7*  NEUTROABS 10.0*  HGB 13.3  HCT 40.1  MCV 92.8  PLT 167   CBG: Recent Labs  Lab 05/25/19 1144  GLUCAP 131*   Hgb A1c No results for input(s): HGBA1C in the last 72 hours. Lipid Profile Recent Labs    05/26/19 0806  CHOL 156  HDL 39*  LDLCALC 94  TRIG 117  CHOLHDL 4.0   Thyroid function studies No results for input(s): TSH, T4TOTAL, T3FREE, THYROIDAB in the last 72 hours.  Invalid input(s): FREET3 Urinalysis No results found for: COLORURINE, APPEARANCEUR, LABSPEC, PHURINE, GLUCOSEU, HGBUR, BILIRUBINUR, KETONESUR, PROTEINUR, UROBILINOGEN, NITRITE, LEUKOCYTESUR  FURTHER DISCHARGE INSTRUCTIONS:   Get Medicines reviewed and adjusted: Please take all your medications with  you for your next visit with your Primary MD   Laboratory/radiological data: Please request your Primary MD to go over all  hospital tests and procedure/radiological results at the follow up, please ask your Primary MD to get all Hospital records sent to his/her office.   In some cases, they will be blood work, cultures and biopsy results pending at the time of your discharge. Please request that your primary care M.D. goes through all the records of your hospital data and follows up on these results.   Also Note the following: If you experience worsening of your admission symptoms, develop shortness of breath, life threatening emergency, suicidal or homicidal thoughts you must seek medical attention immediately by calling 911 or calling your MD immediately  if symptoms less severe.   You must read complete instructions/literature along with all the possible adverse reactions/side effects for all the Medicines you take and that have been prescribed to you. Take any new Medicines after you have completely understood and accpet all the possible adverse reactions/side effects.    Do not drive when taking Pain medications or sleeping medications (Benzodaizepines)   Do not take more than prescribed Pain, Sleep and Anxiety Medications. It is not advisable to combine anxiety,sleep and pain medications without talking with your primary care practitioner   Special Instructions: If you have smoked or chewed Tobacco  in the last 2 yrs please stop smoking, stop any regular Alcohol  and or any Recreational drug use.   Wear Seat belts while driving.   Please note: You were cared for by a hospitalist during your hospital stay. Once you are discharged, your primary care physician will handle any further medical issues. Please note that NO REFILLS for any discharge medications will be authorized once you are discharged, as it is imperative that you return to your primary care physician (or establish a relationship with a primary care physician if you do not have one) for your post hospital discharge needs so that they can reassess your  need for medications and monitor your lab values.  Time coordinating discharge: 40 minutes  SIGNED:  Marzetta Board, MD, PhD 05/26/2019, 2:59 PM

## 2019-05-26 NOTE — Evaluation (Signed)
Occupational Therapy Evaluation Patient Details Name: Scott Cochran MRN: [id number] DOB: 1950-05-03 Today's Date: 05/26/2019    History of Present Illness Scott Cochran is a 69 y.o. male with medical history significant of hypertension, hyperlipidemia, hard of hearing, BPH, kidney stone, former alcohol abuser in remission, who presents with facial droop and slurred speech.   Clinical Impression   Scott Cochran was seen for OT evaluation this date. Prior to hospital admission, pt was independent in all aspects of I/ADLs including driving for community mobility and golfing. Pt lives alone in a 1 story home with 3 steps to enter with B rails and friends/neighbours nearby available PRN. Currently pt reporting symptoms have resolved. Of note, pt recently underwent colonoscopy and right inguinal hernia repair (05/24/19) and demonstrated decreased RLE access for LBD r/t abdominal discomfort; however, pt is MOD I to don/doff B socks using adapted dressing techniques. Pt demonstrates baseline independence to perform ADL and mobility tasks and no strength, sensory, coordination, cognitive, or visual deficits appreciated with assessment. Pt and caregivers instructed in falls prevention and stroke education. No skilled OT needs identified. Will sign off. Please re-consult if additional OT needs arise.     Follow Up Recommendations  No OT follow up    Equipment Recommendations  None recommended by OT    Recommendations for Other Services       Precautions / Restrictions Precautions Precautions: None Restrictions Weight Bearing Restrictions: No      Mobility Bed Mobility Overal bed mobility: Modified Independent             General bed mobility comments: HOB elevated sup<>sit and scooting higher in bed  Transfers Overall transfer level: Independent Equipment used: None             General transfer comment: sit<>stand at bed and commode     Balance Overall balance assessment: Independent                                         ADL either performed or assessed with clinical judgement   ADL Overall ADL's : Modified independent                                       General ADL Comments: IND toilet t/f to standard commode including lines management. MOD I to don/doff socks sitting in bed (discomfort access R foot 2/2 hernia and recent surgery)     Vision Baseline Vision/History: Wears glasses Wears Glasses: At all times Patient Visual Report: No change from baseline       Perception     Praxis      Pertinent Vitals/Pain Pain Assessment: 0-10 Pain Score: 6  Pain Location: R groin  Pain Intervention(s): Limited activity within patient's tolerance;RN gave pain meds during session;Ice applied     Hand Dominance Right   Extremity/Trunk Assessment Upper Extremity Assessment Upper Extremity Assessment: Overall WFL for tasks assessed   Lower Extremity Assessment Lower Extremity Assessment: Overall WFL for tasks assessed       Communication Communication Communication: HOH(Prefers slower communication )   Cognition Arousal/Alertness: Awake/alert Behavior During Therapy: WFL for tasks assessed/performed Overall Cognitive Status: Within Functional Limits for tasks assessed  General Comments       Exercises Exercises: Other exercises Other Exercises Other Exercises: Pt and caregivers educated re: falls prevention, log roll for comfort 2/2 abdominal pain, and stroke education Other Exercises: Self-feeding, toilet t/f, bed mobility, sit<>stand, sup<>sit, LBD, sitting/standing balance/tolerance    Shoulder Instructions      Home Living Family/patient expects to be discharged to:: Private residence Living Arrangements: Alone(small dog ) Available Help at Discharge: Neighbor;Friend(s);Available PRN/intermittently Type of Home: House Home Access: Stairs to enter [employer] of Steps: 3 Entrance Stairs-Rails: Right;Left;Can reach both Home Layout: One level     Bathroom Shower/Tub: Teacher, early years/pre: Standard     Home Equipment: Grab bars - tub/shower;Shower seat   Additional Comments: Per pt, shower chair was for wife but she has since passed and he does not use it      Prior Functioning/Environment Level of Independence: Independent        Comments: Pt indepedent c I/ADLs including groceries and frequently golfs.         OT Problem List: Decreased range of motion      OT Treatment/Interventions:      OT Goals(Current goals can be found in the care plan section) Acute Rehab OT Goals Patient Stated Goal: to return home today OT Goal Formulation: With patient Time For Goal Achievement: 06/02/19 Potential to Achieve Goals: Good  OT Frequency:     Barriers to D/C: Decreased caregiver support          Co-evaluation              AM-PAC OT "6 Clicks" Daily Activity     Outcome Measure Help from another person eating meals?: None Help from another person taking care of personal grooming?: None Help from another person toileting, which includes using toliet, bedpan, or urinal?: None Help from another person bathing (including washing, rinsing, drying)?: A Little Help from another person to put on and taking off regular upper body clothing?: None Help from another person to put on and taking off regular lower body clothing?: A Little 6 Click Score: 22   End of Session    Activity Tolerance: Patient tolerated treatment well Patient left: in bed;with call bell/phone within reach;with family/visitor present  OT Visit Diagnosis: Other abnormalities of gait and mobility (R26.89)                [id number] OT Time Calculation (min): 16 min Charges:  OT General Charges $OT Visit: 1 Visit OT Evaluation $OT Eval Low Complexity: 1 Low  Dessie Coma, M.S. OTR/L  05/26/19, 10:02 AM

## 2019-05-26 NOTE — Progress Notes (Signed)
PT Cancellation Note  Patient Details Name: NICKOLAOS BOGARD MRN: [id number] DOB: 04-14-1950   Cancelled Treatment:    Reason Eval/Treat Not Completed: PT screened, no needs identified, will sign off: Per nursing and neurologist notes pt ambulating independently and is back to baseline.  Spoke to patient who confirmed that he is back to baseline with no noted deficits, has been ambulating independently in his room, and has no desire for PT services.  Will complete PT orders at this time but will reassess pt pending a change in status upon receipt of new PT orders.    Linus Salmons PT, DPT 05/26/19, 12:58 PM

## 2019-05-26 NOTE — Progress Notes (Signed)
Neurology:  Patient ambulating on his own. No focal weakness and is back to baseline. NIHSS of 0.    - Echo pending for d/c - ASA 81mg  and statin daily

## 2019-05-27 LAB — SURGICAL PATHOLOGY

## 2019-05-27 NOTE — Op Note (Signed)
Preoperative diagnosis: Recurrent right inguinal hernia.  Postoperative diagnosis: Same, indirect.  Operative procedure: Repair of recurrent right inguinal hernia with me PerFix plug and mesh.  Operating surgeon: Hervey Ard, MD  Anesthesia: General by LMA, Marcaine 05% with 1: 200,000 units of epinephrine, 30 cc; Toradol 30 mg.  Estimated blood loss: Less than 5 cc.  Clinical note: This 69 year old male underwent bilateral laparoscopic hernia repair about 20 years ago.  He is developed a right recurrence.  He underwent a colonoscopy earlier in the day due to a family of colon cancer in his brother.  He tolerated this well.  The patient received Ancef prior to the procedure.  SCD stockings for DVT prevention.  Operative: With the patient under adequate general anesthesia the groin was cleansed with ChloraPrep and draped.  Hair had previously been removed with clippers.  Field block anesthesia was established.  A 5 cm skin line incision over the anticipated course the inguinal canal it is best to invade the cautery.  The external oblique was in the direction of its fibers.  The ilioinguinal and iliohypogastric nerves were identified and protected.  An indirect sac was noted.  The mesh could be palpated through the internal ring medial to the sac.  This was freed circumferentially.  A Bard PerFix plug was then placed in the preperitoneal space and the mesh anchored into position with a single 0 Surgilon suture the iliopubic tract to the transversus fascia.  The onlay mesh was then anchored to the pubic tubercle.  Interrupted 0 Surgilon sutures were used along the inguinal ligament.  The lateral slit was closed with similar.  The medial and superior aspects of the mesh were anchored to the transverse abdominis aponeurosis.  Toradol was placed into the wound.  S and vessels as well as the nerves were returned to their bed.  The external oblique was closed with a running 2-0 Vicryl suture.  Scarpa's  fascia was with a 3-0 running Vicryl suture.  The skin was closed with a 4-0 Vicryl running subcuticular suture.  Benzoin and Steri-Strips followed by Telfa and Tegaderm dressing were applied.  The patient tolerated the procedure well and was taken recovery in stable condition.

## 2019-05-28 LAB — ECHOCARDIOGRAM COMPLETE
Height: 69 in
Weight: 2976 oz

## 2019-05-28 LAB — HEMOGLOBIN A1C
Hgb A1c MFr Bld: 5.4 % (ref 4.8–5.6)
Mean Plasma Glucose: 108 mg/dL

## 2019-05-29 NOTE — Op Note (Signed)
[facility] Gastroenterology Patient Name: Scott Cochran Procedure Date: 05/24/2019 9:16 AM MRN: [id number] Account #: [id number] Date of Birth: 25-May-1950 Admit Type: Outpatient Age: 69 Room: [facility] ENDO ROOM 1 Gender: Male Note Status: Finalized Procedure:             Colonoscopy Indications:           Screening patient at increased risk: Family history of                         1st-degree relative with colorectal cancer at age 32                         years (or older) Providers:             Robert Bellow, MD Medicines:             Monitored Anesthesia Care Complications:         No immediate complications. Procedure:             Pre-Anesthesia Assessment:                        - Prior to the procedure, a History and Physical was                         performed, and patient medications, allergies and                         sensitivities were reviewed. The patient's tolerance                         of previous anesthesia was reviewed.                        - The risks and benefits of the procedure and the                         sedation options and risks were discussed with the                         patient. All questions were answered and informed                         consent was obtained.                        After obtaining informed consent, the colonoscope was                         passed under direct vision. Throughout the procedure,                         the patient's blood pressure, pulse, and oxygen                         saturations were monitored continuously. The                         Colonoscope was introduced through the anus and  advanced to the the terminal ileum. The colonoscopy                         was performed without difficulty. The patient                         tolerated the procedure well. The quality of the bowel                         preparation was excellent. Findings:      A few  medium-mouthed diverticula were found in the sigmoid colon.      A 5 mm polyp was found in the rectum. The polyp was sessile. Biopsies       were taken with a cold forceps for histology.      The retroflexed view of the distal rectum and anal verge was normal and       showed no anal or rectal abnormalities. Impression:            - Diverticulosis in the sigmoid colon.                        - One 5 mm polyp in the rectum. Biopsied.                        - The distal rectum and anal verge are normal on                         retroflexion view. Recommendation:        - Repeat colonoscopy in 5 years for surveillance. Procedure Code(s):     --- Professional ---                        [phone], Colonoscopy, flexible; with biopsy, single or                         multiple Diagnosis Code(s):     --- Professional ---                        K62.1, Rectal polyp                        Z80.0, Family history of malignant neoplasm of                         digestive organs                        K57.30, Diverticulosis of large intestine without                         perforation or abscess without bleeding CPT copyright 2019 American Medical Association. All rights reserved. The codes documented in this report are preliminary and upon coder review may  be revised to meet current compliance requirements. Robert Bellow, MD 05/29/2019 8:14:18 AM This report has been signed electronically. Number of Addenda: 0 Note Initiated On: 05/24/2019 9:16 AM Scope Withdrawal Time: 0 hours 13 minutes 59 seconds  Total Procedure Duration: 0 hours 21 minutes 40 seconds  Estimated Blood Loss:  Estimated blood loss: none.  [facility]

## 2019-06-12 ENCOUNTER — Ambulatory Visit: Payer: Medicare HMO | Admitting: Dermatology

## 2019-06-12 ENCOUNTER — Other Ambulatory Visit: Payer: Self-pay

## 2019-06-12 DIAGNOSIS — H61002 Unspecified perichondritis of left external ear: Secondary | ICD-10-CM

## 2019-06-12 DIAGNOSIS — D692 Other nonthrombocytopenic purpura: Secondary | ICD-10-CM | POA: Diagnosis not present

## 2019-06-12 DIAGNOSIS — Z85828 Personal history of other malignant neoplasm of skin: Secondary | ICD-10-CM

## 2019-06-12 DIAGNOSIS — Z872 Personal history of diseases of the skin and subcutaneous tissue: Secondary | ICD-10-CM

## 2019-06-12 DIAGNOSIS — L578 Other skin changes due to chronic exposure to nonionizing radiation: Secondary | ICD-10-CM

## 2019-06-12 DIAGNOSIS — L570 Actinic keratosis: Secondary | ICD-10-CM | POA: Diagnosis not present

## 2019-06-12 NOTE — Progress Notes (Signed)
   Follow-Up Visit   Subjective  Scott Cochran is a 69 y.o. male who presents for the following: Actinic Keratosis (bx proven - R ear concha, improved but patient doesn't think it's completely healed ) and CNCH (of the R ear - improved with ILK injections, patient has a similar lesion on the L ear).  The following portions of the chart were reviewed this encounter and updated as appropriate:  Tobacco  Allergies  Meds  Problems  Med Hx  Surg Hx  Fam Hx     Review of Systems:  No other skin or systemic complaints except as noted in HPI or Assessment and Plan.  Objective  Well appearing patient in no apparent distress; mood and affect are within normal limits.  A focused examination was performed including face, scalp, and ears. Relevant physical exam findings are noted in the Assessment and Plan.  Objective  Face, ears, and scalp: Erythematous thin papules/macules with gritty scale.   Objective  R ear concha: Pinkness from biopsy site otherwise clear  Assessment & Plan  AK (actinic keratosis) Face, ears, and scalp  Start Skin Medicinals 5FU mix BID x 7 days to the temples, cheeks, ears, crown/scalp. Advised patient that he doesn't have to treat all areas at once, but instead can treat them separately.  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  History of actinic keratosis R ear concha  Biopsy proven - Clear. Observe for recurrence. Call clinic for new or changing lesions.  Recommend regular skin exams, daily broad-spectrum spf 30+ sunscreen use, and photoprotection.    Purpura - Violaceous macules and patches - Benign - Related to age, sun damage and/or use of blood thinners - Observe - Can use OTC arnica containing moisturizer such as Dermend Bruise Formula if desired - Call for worsening or other  concerns  Actinic Damage - diffuse scaly erythematous macules with underlying dyspigmentation - Recommend daily broad spectrum sunscreen SPF 30+ to sun-exposed areas, reapply every 2 hours as needed.  - Call for new or changing lesions.  History of Basal Cell Carcinoma of the Skin - No evidence of recurrence today - Recommend regular full body skin exams - Recommend daily broad spectrum sunscreen SPF 30+ to sun-exposed areas, reapply every 2 hours as needed.  - Call if any new or changing lesions are noted between office visits   Return in about 6 months (around 12/12/2019).  Luther Redo, CMA, am acting as scribe for [name], MD .   Documentation: I have reviewed the above documentation for accuracy and completeness, and I agree with the above.  [name], MD

## 2019-06-15 ENCOUNTER — Encounter: Payer: Self-pay | Admitting: Dermatology

## 2019-07-19 DIAGNOSIS — N400 Enlarged prostate without lower urinary tract symptoms: Secondary | ICD-10-CM | POA: Insufficient documentation

## 2019-10-11 ENCOUNTER — Other Ambulatory Visit: Payer: Self-pay | Admitting: Ophthalmology

## 2019-10-11 DIAGNOSIS — G43109 Migraine with aura, not intractable, without status migrainosus: Secondary | ICD-10-CM

## 2019-10-30 ENCOUNTER — Ambulatory Visit
Admission: RE | Admit: 2019-10-30 | Discharge: 2019-10-30 | Disposition: A | Discharge status: Home / Self Care | Payer: Medicare HMO | Source: Ambulatory Visit | Attending: Ophthalmology | Admitting: Ophthalmology

## 2019-10-30 ENCOUNTER — Other Ambulatory Visit: Payer: Self-pay

## 2019-10-30 DIAGNOSIS — G43109 Migraine with aura, not intractable, without status migrainosus: Secondary | ICD-10-CM | POA: Insufficient documentation

## 2019-10-30 MED ORDER — GADOBUTROL 1 MMOL/ML IV SOLN
8.0000 mL | Freq: Once | INTRAVENOUS | Status: AC | PRN
  Administered 2019-10-30: 8 mL via INTRAVENOUS

## 2019-11-13 ENCOUNTER — Ambulatory Visit: Payer: Medicare HMO | Admitting: Dermatology

## 2019-11-13 ENCOUNTER — Other Ambulatory Visit: Payer: Self-pay | Admitting: Dermatology

## 2019-11-13 ENCOUNTER — Other Ambulatory Visit: Payer: Self-pay

## 2019-11-13 ENCOUNTER — Encounter: Payer: Self-pay | Admitting: Dermatology

## 2019-11-13 DIAGNOSIS — L720 Epidermal cyst: Secondary | ICD-10-CM

## 2019-11-13 DIAGNOSIS — L570 Actinic keratosis: Secondary | ICD-10-CM

## 2019-11-13 DIAGNOSIS — D2362 Other benign neoplasm of skin of left upper limb, including shoulder: Secondary | ICD-10-CM

## 2019-11-13 DIAGNOSIS — D239 Other benign neoplasm of skin, unspecified: Secondary | ICD-10-CM

## 2019-11-13 MED ORDER — DOXYCYCLINE HYCLATE 100 MG PO TABS: ORAL_TABLET | ORAL | 0 refills | Status: DC

## 2019-11-13 NOTE — Patient Instructions (Signed)

## 2019-11-13 NOTE — Progress Notes (Signed)
   Follow-Up Visit   Subjective  Scott Cochran is a 69 y.o. male who presents for the following: Cyst (Pt present with a recurrent boil on the L back, pt had this area removed 2 different times in the past ). He also has spots on his face that are rough and irritated.  He would like these checked.  He also has other spots to be checked.  The following portions of the chart were reviewed this encounter and updated as appropriate:  Tobacco  Allergies  Meds  Problems  Med Hx  Surg Hx  Fam Hx     Review of Systems:  No other skin or systemic complaints except as noted in HPI or Assessment and Plan.  Objective  Well appearing patient in no apparent distress; mood and affect are within normal limits.  A focused examination was performed including back. Relevant physical exam findings are noted in the Assessment and Plan.  Objective  Left Upper Back: Subcutaneous nodule.   Objective  Left bicep: Firm pink/brown papulenodule with dimple sign.   Objective  L ear above the crease, face, scalp (7): Erythematous thin papules/macules with gritty scale.    Assessment & Plan  Epidermal cyst with rupture and inflammation and abscess formation. Left Upper Back I&D performed today after anesthesia Start Doxycycline 100 mg take 1 tablet po bid x 1 week, then decrease to 1 tablet daily, take with food  Recommend return to the office for surgery removal   Incision and Drainage - Left Upper Back Location: L back   Informed Consent: Discussed risks (permanent scarring, light or dark discoloration, infection, pain, bleeding, bruising, redness, damage to adjacent structures, and recurrence of the lesion) and benefits of the procedure, as well as the alternatives.  Informed consent was obtained.  Preparation: The area was prepped with alcohol.  Anesthesia: Lidocaine 1% with epinephrine  Procedure Details: An incision was made overlying the lesion. The lesion drained pus and blood.  A large  amount of pus, keratin, fluid was drained. Curettage and irrigation was performed.    Antibiotic ointment and a sterile pressure dressing were applied. The patient tolerated procedure well.  Total number of lesions drained: 1  Plan: The patient was instructed on post-op care. Recommend OTC analgesia as needed for pain.   Discussed that after it heals it will likely need to be excised for full eradication.  We will check in 4 months to look at this area and schedule surgery if indicated.  Ordered Medications: doxycycline (VIBRA-TABS) 100 MG tablet  Dermatofibroma Left bicep The patient will observe these symptoms, and report promptly any worsening or unexpected persistence.  If well, may return prn.   AK (actinic keratosis) (7) L ear above the crease, face, scalp  Destruction of lesion - L ear above the crease, face, scalp Complexity: simple   Destruction method: cryotherapy   Informed consent: discussed and consent obtained   Timeout:  patient name, date of birth, surgical site, and procedure verified Lesion destroyed using liquid nitrogen: Yes   Region frozen until ice ball extended beyond lesion: Yes   Outcome: patient tolerated procedure well with no complications   Post-procedure details: wound care instructions given    Return for surgery removal of a cyst .  I, [name], CMA, am acting as scribe for [name], MD .  Documentation: I have reviewed the above documentation for accuracy and completeness, and I agree with the above.  [name], MD

## 2019-12-04 ENCOUNTER — Ambulatory Visit: Payer: Medicare HMO | Admitting: Dermatology

## 2019-12-30 ENCOUNTER — Other Ambulatory Visit: Payer: Self-pay

## 2019-12-30 ENCOUNTER — Ambulatory Visit: Payer: Medicare HMO | Admitting: Dermatology

## 2019-12-30 DIAGNOSIS — L720 Epidermal cyst: Secondary | ICD-10-CM

## 2019-12-30 DIAGNOSIS — L570 Actinic keratosis: Secondary | ICD-10-CM | POA: Diagnosis not present

## 2019-12-30 DIAGNOSIS — D692 Other nonthrombocytopenic purpura: Secondary | ICD-10-CM | POA: Diagnosis not present

## 2019-12-30 DIAGNOSIS — L820 Inflamed seborrheic keratosis: Secondary | ICD-10-CM | POA: Diagnosis not present

## 2019-12-30 DIAGNOSIS — L578 Other skin changes due to chronic exposure to nonionizing radiation: Secondary | ICD-10-CM | POA: Diagnosis not present

## 2019-12-30 NOTE — Progress Notes (Signed)
   Follow-Up Visit   Subjective  Scott Cochran is a 69 y.o. male who presents for the following: Actinic Keratosis (recheck for any new or persistent skin lesions on the face and scalp) and cyst (S/P I&D and Doxycycline - patient states that cyst is doing fine now).  The following portions of the chart were reviewed this encounter and updated as appropriate:  Tobacco  Allergies  Meds  Problems  Med Hx  Surg Hx  Fam Hx     Review of Systems:  No other skin or systemic complaints except as noted in HPI or Assessment and Plan.  Objective  Well appearing patient in no apparent distress; mood and affect are within normal limits.  A focused examination was performed including the back, face, scalp, arms, and hands. Relevant physical exam findings are noted in the Assessment and Plan.  Objective  Scalp, face, and ears x 16 (16): Erythematous thin papules/macules with gritty scale.   Objective  Left Upper Back: Firm SQ nodule.  Objective  Hands x 17 (17): Erythematous keratotic or waxy stuck-on papule or plaque.   Assessment & Plan  AK (actinic keratosis) (16) Scalp, face, and ears x 16  Destruction of lesion - Scalp, face, and ears x 16 Complexity: simple   Destruction method: cryotherapy   Informed consent: discussed and consent obtained   Timeout:  patient name, date of birth, surgical site, and procedure verified Lesion destroyed using liquid nitrogen: Yes   Region frozen until ice ball extended beyond lesion: Yes   Outcome: patient tolerated procedure well with no complications   Post-procedure details: wound care instructions given    Epidermal inclusion cyst -status post rupture; abscess formation and I&D.  Improved and asymptomatic now. Left Upper Back May need excision in the future if recurs.  Patient understands Benign appearing, no tx needed.  Inflamed seborrheic keratosis (17) Hands x 17  Destruction of lesion - Hands x 17 Complexity: simple   Destruction  method: cryotherapy   Informed consent: discussed and consent obtained   Timeout:  patient name, date of birth, surgical site, and procedure verified Lesion destroyed using liquid nitrogen: Yes   Region frozen until ice ball extended beyond lesion: Yes   Outcome: patient tolerated procedure well with no complications   Post-procedure details: wound care instructions given     Actinic Damage - chronic, secondary to cumulative UV radiation exposure/sun exposure over time - diffuse scaly erythematous macules with underlying dyspigmentation - Recommend daily broad spectrum sunscreen SPF 30+ to sun-exposed areas, reapply every 2 hours as needed.  - Call for new or changing lesions.  Purpura - Chronic; persistent and recurrent.  Treatable, but not curable. - Violaceous macules and patches - Benign - Related to age, sun damage and/or use of blood thinners - Observe - Can use OTC arnica containing moisturizer such as Dermend Bruise Formula if desired - Call for worsening or other concerns  Return in about 6 months (around 06/28/2020) for AK F/U .  Luther Redo, CMA, am acting as scribe for [name], MD .  Documentation: I have reviewed the above documentation for accuracy and completeness, and I agree with the above.  [name], MD

## 2019-12-31 ENCOUNTER — Encounter: Payer: Self-pay | Admitting: Dermatology

## 2020-01-16 ENCOUNTER — Other Ambulatory Visit: Payer: Self-pay | Admitting: Otolaryngology

## 2020-01-20 LAB — SURGICAL PATHOLOGY

## 2020-02-25 ENCOUNTER — Other Ambulatory Visit: Payer: Self-pay | Admitting: Dermatology

## 2020-03-12 ENCOUNTER — Other Ambulatory Visit: Payer: Self-pay

## 2020-03-12 ENCOUNTER — Ambulatory Visit: Payer: Medicare HMO | Admitting: Dermatology

## 2020-03-12 DIAGNOSIS — L570 Actinic keratosis: Secondary | ICD-10-CM | POA: Diagnosis not present

## 2020-03-12 DIAGNOSIS — S01501A Unspecified open wound of lip, initial encounter: Secondary | ICD-10-CM | POA: Diagnosis not present

## 2020-03-12 DIAGNOSIS — T148XXA Other injury of unspecified body region, initial encounter: Secondary | ICD-10-CM

## 2020-03-12 NOTE — Progress Notes (Signed)
Follow-Up Visit   Subjective  Scott Cochran is a 70 y.o. male who presents for the following: Skin Problem (Patient has a spot at upper lip that came a few days ago. He advises that a biopsy was done many years ago near this area. About 3-4 years ago he had the same spot frozen at [facility]. He also has a spot at right cheek that is sore to touch when shaving. Also has a spot at left hand that itches, won't heal. ).  He reports he has been using the prescription liquid solution treatment daily for his precancer spots on the scalp, face, and hand.   The following portions of the chart were reviewed this encounter and updated as appropriate:   Tobacco  Allergies  Meds  Problems  Med Hx  Surg Hx  Fam Hx      Review of Systems:  No other skin or systemic complaints except as noted in HPI or Assessment and Plan.  Objective  Well appearing patient in no apparent distress; mood and affect are within normal limits.  A focused examination was performed including face, left hand, scalp. Relevant physical exam findings are noted in the Assessment and Plan.  Objective  right temple x 2, right cheek x 1, scalp x 2, left dorsal hand x 3, left helix x 1, left upper vermillion border x 1 (10): Erythematous thin papules/macules with gritty scale.   Objective  Left Upper Cutaneous Lip: Erythematous papule with erosion   Assessment & Plan  AK (actinic keratosis) (10) right temple x 2, right cheek x 1, scalp x 2, left dorsal hand x 3, left helix x 1, left upper vermillion border x 1  Patient has been prescribed mometasone solution in the past to use at scalp for psoriasis reports he is using the prescription solution for AK's.  We were unable to identify any other prescriptions that are a liquid or solution.  Advised him not to use mometasone at face and advised to avoid on anything he thinks is a precancer due to risk of decreasing his immune system and making precancers worse.  AK at  upper lip with small cut from shaving. Advised to use neosporin 2-3 times per day until healed (he declined prescription mupirocin ointment). Also provided patient with pager number to call if he has any worsening over weekend.  Destruction of lesion - right temple x 2, right cheek x 1, scalp x 2, left dorsal hand x 3, left helix x 1, left upper vermillion border x 1 Complexity: simple   Destruction method: cryotherapy   Informed consent: discussed and consent obtained   Lesion destroyed using liquid nitrogen: Yes   Cryotherapy cycles:  2 Outcome: patient tolerated procedure well with no complications   Post-procedure details: wound care instructions given    Open wound Left Upper Cutaneous Lip  Patient reports shaving over this.  He denies any tingling or burning sensation or associated pain.  Minimal suspicion for HSV given lack of symptoms in appearance on exam today but we did discuss this as a possibility.  He deferred evaluation for this.  Favor AK with trauma/cut.  Patient strongly wishes to have this treated today rather than healed for re-evaluation and then rechecked before proceeding with treatment.  After cryotherapy, he will treat with Neosporin 2-3 times per day for the next few days until healed.  We discussed using prescription mupirocin antibiotic ointment while site is healing but he deferred today.   Actinic Damage -  chronic, secondary to cumulative UV radiation exposure/sun exposure over time - diffuse scaly erythematous macules with underlying dyspigmentation - Recommend daily broad spectrum sunscreen SPF 30+ to sun-exposed areas, reapply every 2 hours as needed.  - Call for new or changing lesions.  Return for AK follow up as scheduled.  Graciella Belton, RMA, am acting as scribe for Forest Gleason, MD .  Documentation: I have reviewed the above documentation for accuracy and completeness, and I agree with the above.  Forest Gleason, MD

## 2020-03-12 NOTE — Patient Instructions (Addendum)
Cryotherapy Aftercare  . Wash gently with soap and water everyday.   Marland Kitchen Apply Vaseline and Band-Aid daily until healed.  Prior to procedure, discussed risks of blister formation, small wound, skin dyspigmentation, or rare scar following cryotherapy.   Apply neosporin to area at upper lip a few times daily.   Mometasone solution is to use at scalp for itchy areas once daily as needed. Avoid applying to face, groin, and axilla. Use as directed. Risk of skin atrophy with long-term use reviewed.   Avoid applying mometasone solution to precancers.  Call if bump at left hand does not clear up.

## 2020-03-16 ENCOUNTER — Encounter: Payer: Self-pay | Admitting: Dermatology

## 2020-03-23 ENCOUNTER — Ambulatory Visit: Payer: Medicare HMO | Admitting: Dermatology

## 2020-04-15 ENCOUNTER — Ambulatory Visit (INDEPENDENT_AMBULATORY_CARE_PROVIDER_SITE_OTHER): Payer: Medicare HMO | Admitting: Dermatology

## 2020-04-15 ENCOUNTER — Encounter: Payer: Self-pay | Admitting: Dermatology

## 2020-04-15 ENCOUNTER — Other Ambulatory Visit: Payer: Self-pay

## 2020-04-15 DIAGNOSIS — L719 Rosacea, unspecified: Secondary | ICD-10-CM

## 2020-04-15 DIAGNOSIS — L570 Actinic keratosis: Secondary | ICD-10-CM | POA: Diagnosis not present

## 2020-04-15 DIAGNOSIS — L578 Other skin changes due to chronic exposure to nonionizing radiation: Secondary | ICD-10-CM

## 2020-04-15 NOTE — Patient Instructions (Signed)
Apply Rhofade to face every morning

## 2020-04-15 NOTE — Progress Notes (Signed)
   Follow-Up Visit   Subjective  Scott Cochran is a 70 y.o. male who presents for the following: Actinic Keratosis (L upper lip vermillion border, Dr. Laurence Ferrari txted with EG@ 03/12/20). He also wonders about treatment for the redness and vesicles on his face.  It does tend to sting and irritate him also.  The following portions of the chart were reviewed this encounter and updated as appropriate:   Tobacco  Allergies  Meds  Problems  Med Hx  Surg Hx  Fam Hx     Review of Systems:  No other skin or systemic complaints except as noted in HPI or Assessment and Plan.  Objective  Well appearing patient in no apparent distress; mood and affect are within normal limits.  A focused examination was performed including face. Relevant physical exam findings are noted in the Assessment and Plan.  Objective  midline above vermillion border x 1: Pink scaly macules   Images      Objective  Head - Anterior (Face): Erythema and telangiectasias cheeks, chin   Assessment & Plan  AK (actinic keratosis) midline above vermillion border x 1  Destruction of lesion - midline above vermillion border x 1 Complexity: simple   Destruction method: cryotherapy   Informed consent: discussed and consent obtained   Timeout:  patient name, date of birth, surgical site, and procedure verified Lesion destroyed using liquid nitrogen: Yes   Region frozen until ice ball extended beyond lesion: Yes   Outcome: patient tolerated procedure well with no complications   Post-procedure details: wound care instructions given    Rosacea and actinic changes with telangiectasias and erythema of the face Head - Anterior (Face) Start Rhofade qam, samples   Actinic Damage - chronic, secondary to cumulative UV radiation exposure/sun exposure over time - diffuse scaly erythematous macules with underlying dyspigmentation - Recommend daily broad spectrum sunscreen SPF 30+ to sun-exposed areas, reapply every 2 hours as  needed.  - Recommend staying in the shade or wearing long sleeves, sun glasses (UVA+UVB protection) and wide brim hats (4-inch brim around the entire circumference of the hat). - Call for new or changing lesions.  Return for as scheduled.  I, Scott Cochran, RMA, am acting as scribe for Scott Ser, MD .  Documentation: I have reviewed the above documentation for accuracy and completeness, and I agree with the above.  Scott Ser, MD

## 2020-07-02 ENCOUNTER — Other Ambulatory Visit: Payer: Self-pay

## 2020-07-02 ENCOUNTER — Ambulatory Visit: Payer: Medicare HMO | Admitting: Dermatology

## 2020-07-02 ENCOUNTER — Encounter: Payer: Self-pay | Admitting: Dermatology

## 2020-07-02 DIAGNOSIS — L578 Other skin changes due to chronic exposure to nonionizing radiation: Secondary | ICD-10-CM

## 2020-07-02 DIAGNOSIS — L570 Actinic keratosis: Secondary | ICD-10-CM

## 2020-07-02 DIAGNOSIS — D692 Other nonthrombocytopenic purpura: Secondary | ICD-10-CM | POA: Diagnosis not present

## 2020-07-02 DIAGNOSIS — L820 Inflamed seborrheic keratosis: Secondary | ICD-10-CM | POA: Diagnosis not present

## 2020-07-02 DIAGNOSIS — L719 Rosacea, unspecified: Secondary | ICD-10-CM | POA: Diagnosis not present

## 2020-07-02 DIAGNOSIS — L409 Psoriasis, unspecified: Secondary | ICD-10-CM | POA: Diagnosis not present

## 2020-07-02 NOTE — Progress Notes (Signed)
Follow-Up Visit   Subjective  Scott Cochran is a 70 y.o. male who presents for the following: Actinic Keratosis (Face, scalp, 44m f/u) and Psoriasis (Scalp, mometasone lotion qd).  The following portions of the chart were reviewed this encounter and updated as appropriate:   Tobacco  Allergies  Meds  Problems  Med Hx  Surg Hx  Fam Hx     Review of Systems:  No other skin or systemic complaints except as noted in HPI or Assessment and Plan.  Objective  Well appearing patient in no apparent distress; mood and affect are within normal limits.  A focused examination was performed including face, scalp. Relevant physical exam findings are noted in the Assessment and Plan.  Objective  face, ears x 18 (18): Pink scaly macules   Objective  Head - Anterior (Face): Erythema face  Objective  Scalp x 3 (3): Erythematous keratotic or waxy stuck-on papule or plaque.   Objective  Scalp: Scaliness scalp   Assessment & Plan    Purpura - Chronic; persistent and recurrent.  Treatable, but not curable. - Violaceous macules and patches - Benign - Related to trauma, age, sun damage and/or use of blood thinners, chronic use of topical and/or oral steroids - Observe - Can use OTC arnica containing moisturizer such as Dermend Bruise Formula if desired - Call for worsening or other concerns  Actinic Damage, Severe - chronic, secondary to cumulative UV radiation exposure/sun exposure over time - diffuse scaly erythematous macules with underlying dyspigmentation - Recommend daily broad spectrum sunscreen SPF 30+ to sun-exposed areas, reapply every 2 hours as needed.  - Recommend staying in the shade or wearing long sleeves, sun glasses (UVA+UVB protection) and wide brim hats (4-inch brim around the entire circumference of the hat). - Call for new or changing lesions.  AK (actinic keratosis) (18) face, ears x 18  Destruction of lesion - face, ears x 18 Complexity: simple    Destruction method: cryotherapy   Informed consent: discussed and consent obtained   Timeout:  patient name, date of birth, surgical site, and procedure verified Lesion destroyed using liquid nitrogen: Yes   Region frozen until ice ball extended beyond lesion: Yes   Outcome: patient tolerated procedure well with no complications   Post-procedure details: wound care instructions given    Rosacea Head - Anterior (Face)  Rosacea is a chronic progressive skin condition usually affecting the face of adults, causing redness and/or acne bumps. It is treatable but not curable. It sometimes affects the eyes (ocular rosacea) as well. It may respond to topical and/or systemic medication and can flare with stress, sun exposure, alcohol, exercise and some foods.  Daily application of broad spectrum spf 30+ sunscreen to face is recommended to reduce flares.  Pt did not see any improvement with Rhofade  Discussed BBL, $350 per treatment session, several treatments for best results.  Inflamed seborrheic keratosis (3) Scalp x 3  Destruction of lesion - Scalp x 3 Complexity: simple   Destruction method: cryotherapy   Informed consent: discussed and consent obtained   Timeout:  patient name, date of birth, surgical site, and procedure verified Lesion destroyed using liquid nitrogen: Yes   Region frozen until ice ball extended beyond lesion: Yes   Outcome: patient tolerated procedure well with no complications   Post-procedure details: wound care instructions given    Psoriasis Scalp  Psoriasis is a chronic non-curable, but treatable genetic/hereditary disease that may have other systemic features affecting other organ systems such as  joints (Psoriatic Arthritis). It is associated with an increased risk of inflammatory bowel disease, heart disease, non-alcoholic fatty liver disease, and depression.    Cont Mometasone lotion qd up to 5d/wk  aa scalp prn flares  Discussed [place] Nail, information  given   Return in about 6 months (around 01/02/2021) for UBSE, Hx of BCC, AK f/u, Psoriasis f/u.  I, [name], RMA, am acting as scribe for [name], MD .  Documentation: I have reviewed the above documentation for accuracy and completeness, and I agree with the above.  [name], MD

## 2020-07-02 NOTE — Patient Instructions (Addendum)
If you have any questions or concerns for your doctor, please call our main line at [phone] and press option 4 to reach your doctor's medical assistant. If no one answers, please leave a voicemail as directed and we will return your call as soon as possible. Messages left after 4 pm will be answered the following business day.   You may also send [place] a message via [place]. We typically respond to MyChart messages within 1-2 business days.  For prescription refills, please ask your pharmacy to contact our office. Our fax number is [phone].  If you have an urgent issue when the clinic is closed that cannot wait until the next business day, you can page your doctor at the number below.    Please note that while we do our best to be available for urgent issues outside of office hours, we are not available 24/7.   If you have an urgent issue and are unable to reach [place], you may choose to seek medical care at your doctor's office, retail clinic, urgent care center, or emergency room.  If you have a medical emergency, please immediately call 911 or go to the emergency department.  Pager Numbers  - Dr. Nehemiah Massed: [phone]  - Dr. Laurence Ferrari: [phone]  - Dr. Nicole Kindred: [phone]  In the event of inclement weather, please call our main line at [phone] for an update on the status of any delays or closures.  Dermatology Medication Tips: Please keep the boxes that topical medications come in in order to help keep track of the instructions about where and how to use these. Pharmacies typically print the medication instructions only on the boxes and not directly on the medication tubes.   If your medication is too expensive, please contact our office at [phone] option 4 or send [place] a message through [place].   We are unable to tell what your co-pay for medications will be in advance as this is different depending on your insurance coverage. However, we may be able to find a substitute  medication at lower cost or fill out paperwork to get insurance to cover a needed medication.   If a prior authorization is required to get your medication covered by your insurance company, please allow [place] 1-2 business days to complete this process.  Drug prices often vary depending on where the prescription is filled and some pharmacies may offer cheaper prices.  The website www.goodrx.com contains coupons for medications through different pharmacies. The prices here do not account for what the cost may be with help from insurance (it may be cheaper with your insurance), but the website can give you the price if you did not use any insurance.  - You can print the associated coupon and take it with your prescription to the pharmacy.  - You may also stop by our office during regular business hours and pick up a GoodRx coupon card.  - If you need your prescription sent electronically to a different pharmacy, notify our office through [facility] or by phone at [phone] option 4.  For Bruising on arms Can use OTC arnica containing moisturizer such as Dermend Bruise Formula if desired   For Psoriasis in scalp Continue the Mometasone lotion daily up to 4 days a week as needed for flares

## 2020-07-05 ENCOUNTER — Other Ambulatory Visit: Payer: Self-pay | Admitting: Dermatology

## 2020-07-10 ENCOUNTER — Encounter: Payer: Self-pay | Admitting: Dermatology

## 2020-07-14 ENCOUNTER — Telehealth: Payer: Self-pay

## 2020-07-14 NOTE — Telephone Encounter (Signed)
Followed up with pt from 07/02/20 appointment.  Pt is using the Mometasone on the scalp.  Advised that the Mometasone helps with the psoriasis and not the AKs.  Advised the LN2 helps with the AKs./sh

## 2020-07-16 ENCOUNTER — Telehealth: Payer: Self-pay

## 2020-07-16 MED ORDER — CALCIPOTRIENE 0.005 % EX SOLN: 1.0000 "application " | CUTANEOUS | 6 refills | Status: DC

## 2020-07-16 NOTE — Telephone Encounter (Signed)
Spoke to patient and advised him to use the Mometasone lotion to scalp 3 days a week Monday, Wednesday and Fridays as needed for psoriasis.  Advised to start Calcipotriene solution 4 days a week to scalp, (when not using Mometasone lotion) Tuesday, Thursday, Saturday, and Sunday.  Sent Calcipotriene into [employer].  Pt advised./sh

## 2020-07-16 NOTE — Telephone Encounter (Signed)
Ok.  Please have him only use Mometasone solution 3 days a week and only as needed. May use Calcipotriene solution qd to scalp for psoriasis 4 days per week at bedtime (on days not using Mometasone solution) disp trade with 6 rf.  Please explain to pt above and send in Calcipotriene and advised to let us know if expensive or not covered etc. -- and make sure specific instructions with Rx

## 2020-11-05 ENCOUNTER — Other Ambulatory Visit: Payer: Self-pay

## 2020-11-05 ENCOUNTER — Ambulatory Visit: Payer: Medicare HMO | Admitting: Dermatology

## 2020-11-05 DIAGNOSIS — L821 Other seborrheic keratosis: Secondary | ICD-10-CM

## 2020-11-05 DIAGNOSIS — L820 Inflamed seborrheic keratosis: Secondary | ICD-10-CM

## 2020-11-05 DIAGNOSIS — L578 Other skin changes due to chronic exposure to nonionizing radiation: Secondary | ICD-10-CM

## 2020-11-05 DIAGNOSIS — L570 Actinic keratosis: Secondary | ICD-10-CM | POA: Diagnosis not present

## 2020-11-05 NOTE — Progress Notes (Signed)
   Follow-Up Visit   Subjective  Scott Cochran is a 70 y.o. male who presents for the following: Other (Spot of upper lip that gets irritated with shaving). He has other spots to be checked too.  The following portions of the chart were reviewed this encounter and updated as appropriate:   Tobacco  Allergies  Meds  Problems  Med Hx  Surg Hx  Fam Hx     Review of Systems:  No other skin or systemic complaints except as noted in HPI or Assessment and Plan.  Objective  Well appearing patient in no apparent distress; mood and affect are within normal limits.  A focused examination was performed including face. Relevant physical exam findings are noted in the Assessment and Plan.  Left upper lip above vermillion border x 1, right infra auricular x 1, face x 4 (6) Erythematous thin papules/macules with gritty scale.   Right neck post hairline x 2 (2) Erythematous keratotic or waxy stuck-on papule or plaque.    Assessment & Plan   Actinic Damage - chronic, secondary to cumulative UV radiation exposure/sun exposure over time - diffuse scaly erythematous macules with underlying dyspigmentation - Recommend daily broad spectrum sunscreen SPF 30+ to sun-exposed areas, reapply every 2 hours as needed.  - Recommend staying in the shade or wearing long sleeves, sun glasses (UVA+UVB protection) and wide brim hats (4-inch brim around the entire circumference of the hat). - Call for new or changing lesions.  Seborrheic Keratoses - Stuck-on, waxy, tan-brown papules and/or plaques  - Benign-appearing - Discussed benign etiology and prognosis. - Observe - Call for any changes  AK (actinic keratosis) (6) Left upper lip above vermillion border x 1, right infra auricular x 1, face x 4  Destruction of lesion - Left upper lip above vermillion border x 1, right infra auricular x 1, face x 4 Complexity: simple   Destruction method: cryotherapy   Informed consent: discussed and consent obtained    Timeout:  patient name, date of birth, surgical site, and procedure verified Lesion destroyed using liquid nitrogen: Yes   Region frozen until ice ball extended beyond lesion: Yes   Outcome: patient tolerated procedure well with no complications   Post-procedure details: wound care instructions given    Inflamed seborrheic keratosis Right neck post hairline x 2  Destruction of lesion - Right neck post hairline x 2 Complexity: simple   Destruction method: cryotherapy   Informed consent: discussed and consent obtained   Timeout:  patient name, date of birth, surgical site, and procedure verified Lesion destroyed using liquid nitrogen: Yes   Region frozen until ice ball extended beyond lesion: Yes   Outcome: patient tolerated procedure well with no complications   Post-procedure details: wound care instructions given    Return if symptoms worsen or fail to improve.  I, Ashok Cordia, CMA, am acting as scribe for [name], MD . Documentation: I have reviewed the above documentation for accuracy and completeness, and I agree with the above.  [name], MD

## 2020-11-05 NOTE — Patient Instructions (Signed)

## 2020-11-10 ENCOUNTER — Encounter: Payer: Self-pay | Admitting: Dermatology

## 2020-12-28 ENCOUNTER — Other Ambulatory Visit: Payer: Self-pay

## 2020-12-28 ENCOUNTER — Ambulatory Visit: Payer: Medicare HMO | Admitting: Dermatology

## 2020-12-28 DIAGNOSIS — Z85828 Personal history of other malignant neoplasm of skin: Secondary | ICD-10-CM

## 2020-12-28 DIAGNOSIS — D692 Other nonthrombocytopenic purpura: Secondary | ICD-10-CM | POA: Diagnosis not present

## 2020-12-28 DIAGNOSIS — Z1283 Encounter for screening for malignant neoplasm of skin: Secondary | ICD-10-CM | POA: Diagnosis not present

## 2020-12-28 DIAGNOSIS — L814 Other melanin hyperpigmentation: Secondary | ICD-10-CM

## 2020-12-28 DIAGNOSIS — L570 Actinic keratosis: Secondary | ICD-10-CM

## 2020-12-28 DIAGNOSIS — D485 Neoplasm of uncertain behavior of skin: Secondary | ICD-10-CM

## 2020-12-28 DIAGNOSIS — L409 Psoriasis, unspecified: Secondary | ICD-10-CM

## 2020-12-28 DIAGNOSIS — D1801 Hemangioma of skin and subcutaneous tissue: Secondary | ICD-10-CM

## 2020-12-28 DIAGNOSIS — Z872 Personal history of diseases of the skin and subcutaneous tissue: Secondary | ICD-10-CM

## 2020-12-28 DIAGNOSIS — L578 Other skin changes due to chronic exposure to nonionizing radiation: Secondary | ICD-10-CM

## 2020-12-28 DIAGNOSIS — L821 Other seborrheic keratosis: Secondary | ICD-10-CM | POA: Diagnosis not present

## 2020-12-28 DIAGNOSIS — D229 Melanocytic nevi, unspecified: Secondary | ICD-10-CM

## 2020-12-28 NOTE — Patient Instructions (Addendum)
Cryotherapy Aftercare  Wash gently with soap and water everyday.   Apply Vaseline and Band-Aid daily until healed.    Wound Care Instructions  Cleanse wound gently with soap and water once a day then pat dry with clean gauze. Apply a thing coat of Petrolatum (petroleum jelly, "Vaseline") over the wound (unless you have an allergy to this). We recommend that you use a new, sterile tube of Vaseline. Do not pick or remove scabs. Do not remove the yellow or white "healing tissue" from the base of the wound.  Cover the wound with fresh, clean, nonstick gauze and secure with paper tape. You may use Band-Aids in place of gauze and tape if the would is small enough, but would recommend trimming much of the tape off as there is often too much. Sometimes Band-Aids can irritate the skin.  You should call the office for your biopsy report after 1 week if you have not already been contacted.  If you experience any problems, such as abnormal amounts of bleeding, swelling, significant bruising, significant pain, or evidence of infection, please call the office immediately.  FOR ADULT SURGERY PATIENTS: If you need something for pain relief you may take 1 extra strength Tylenol (acetaminophen) AND 2 Ibuprofen (200mg  each) together every 4 hours as needed for pain. (do not take these if you are allergic to them or if you have a reason you should not take them.) Typically, you may only need pain medication for 1 to 3 days.    Melanoma ABCDEs  Melanoma is the most dangerous type of skin cancer, and is the leading cause of death from skin disease.  You are more likely to develop melanoma if you: Have light-colored skin, light-colored eyes, or red or blond hair Spend a lot of time in the sun Tan regularly, either outdoors or in a tanning bed Have had blistering sunburns, especially during childhood Have a close family member who has had a melanoma Have atypical moles or large birthmarks  Early detection of  melanoma is key since treatment is typically straightforward and cure rates are extremely high if we catch it early.   The first sign of melanoma is often a change in a mole or a new dark spot.  The ABCDE system is a way of remembering the signs of melanoma.  A for asymmetry:  The two halves do not match. B for border:  The edges of the growth are irregular. C for color:  A mixture of colors are present instead of an even brown color. D for diameter:  Melanomas are usually (but not always) greater than 68mm - the size of a pencil eraser. E for evolution:  The spot keeps changing in size, shape, and color.  Please check your skin once per month between visits. You can use a small mirror in front and a large mirror behind you to keep an eye on the back side or your body.   If you see any new or changing lesions before your next follow-up, please call to schedule a visit.  Please continue daily skin protection including broad spectrum sunscreen SPF 30+ to sun-exposed areas, reapplying every 2 hours as needed when you're outdoors.    If you have any questions or concerns for your doctor, please call our main line at [phone] and press option 4 to reach your doctor's medical assistant. If no one answers, please leave a voicemail as directed and we will return your call as soon as possible. Messages left after  4 pm will be answered the following business day.   You may also send [place] a message via [place]. We typically respond to MyChart messages within 1-2 business days.  For prescription refills, please ask your pharmacy to contact our office. Our fax number is [phone].  If you have an urgent issue when the clinic is closed that cannot wait until the next business day, you can page your doctor at the number below.    Please note that while we do our best to be available for urgent issues outside of office hours, we are not available 24/7.   If you have an urgent issue and are unable to  reach [place], you may choose to seek medical care at your doctor's office, retail clinic, urgent care center, or emergency room.  If you have a medical emergency, please immediately call 911 or go to the emergency department.  Pager Numbers  - Dr. Nehemiah Massed: [phone]  - Dr. Laurence Ferrari: [phone]  - Dr. Nicole Kindred: [phone]  In the event of inclement weather, please call our main line at [phone] for an update on the status of any delays or closures.  Dermatology Medication Tips: Please keep the boxes that topical medications come in in order to help keep track of the instructions about where and how to use these. Pharmacies typically print the medication instructions only on the boxes and not directly on the medication tubes.   If your medication is too expensive, please contact our office at [phone] option 4 or send [place] a message through [place].   We are unable to tell what your co-pay for medications will be in advance as this is different depending on your insurance coverage. However, we may be able to find a substitute medication at lower cost or fill out paperwork to get insurance to cover a needed medication.   If a prior authorization is required to get your medication covered by your insurance company, please allow [place] 1-2 business days to complete this process.  Drug prices often vary depending on where the prescription is filled and some pharmacies may offer cheaper prices.  The website www.goodrx.com contains coupons for medications through different pharmacies. The prices here do not account for what the cost may be with help from insurance (it may be cheaper with your insurance), but the website can give you the price if you did not use any insurance.  - You can print the associated coupon and take it with your prescription to the pharmacy.  - You may also stop by our office during regular business hours and pick up a GoodRx coupon card.  - If you need your prescription  sent electronically to a different pharmacy, notify our office through [facility] or by phone at [phone] option 4.

## 2020-12-28 NOTE — Progress Notes (Signed)
Follow-Up Visit   Subjective  Scott Cochran is a 70 y.o. male who presents for the following: UBSE (Patient here for upper body skin exam and skin cancer screening. Patient has hx of BCC and AK's. Patient not aware of any new or changing spots. 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 /). The patient presents for Upper Body Skin Exam (UBSE) for skin cancer screening and mole check.  The following portions of the chart were reviewed this encounter and updated as appropriate:   Tobacco  Allergies  Meds  Problems  Med Hx  Surg Hx  Fam Hx     Review of Systems:  No other skin or systemic complaints except as noted in HPI or Assessment and Plan.  Objective  Well appearing patient in no apparent distress; mood and affect are within normal limits.  All skin waist up examined.  right cheek zygoma x 2, scalp x 3 (5) Erythematous thin papules/macules with gritty scale.   left medial cheek lateral nose 0.5 cm flesh colored papule      Assessment & Plan  AK (actinic keratosis) (5) right cheek zygoma x 2, scalp x 3  Destruction of lesion - right cheek zygoma x 2, scalp x 3 Complexity: simple   Destruction method: cryotherapy   Informed consent: discussed and consent obtained   Timeout:  patient name, date of birth, surgical site, and procedure verified Lesion destroyed using liquid nitrogen: Yes   Region frozen until ice ball extended beyond lesion: Yes   Outcome: patient tolerated procedure well with no complications   Post-procedure details: wound care instructions given    Neoplasm of uncertain behavior of skin left medial cheek lateral nose  Epidermal / dermal shaving  Lesion diameter (cm):  0.5 Informed consent: discussed and consent obtained   Timeout: patient name, date of birth, surgical site, and procedure verified   Procedure prep:  Patient was prepped and draped in usual sterile fashion Prep type:  Isopropyl alcohol Anesthesia: the lesion  was anesthetized in a standard fashion   Anesthetic:  1% lidocaine w/ epinephrine 1-100,000 buffered w/ 8.4% NaHCO3 Instrument used: flexible razor blade   Hemostasis achieved with: pressure, aluminum chloride and electrodesiccation   Outcome: patient tolerated procedure well   Post-procedure details: sterile dressing applied and wound care instructions given   Dressing type: bandage and petrolatum    Specimen 1 - Surgical pathology Differential Diagnosis: Sebaceous Hyperplasia r/o BCC  Check Margins: No 0.5 cm flesh colored papule  Skin cancer screening  Psoriasis of the scalp Psoriasis is a chronic non-curable, but treatable genetic/hereditary disease that may have other systemic features affecting other organ systems such as joints (Psoriatic Arthritis). It is associated with an increased risk of inflammatory bowel disease, heart disease, non-alcoholic fatty liver disease, and depression.    Mometasone solution as needed to the scalp.  Lentigines - Scattered tan macules - Due to sun exposure - Benign-appearing, observe - Recommend daily broad spectrum sunscreen SPF 30+ to sun-exposed areas, reapply every 2 hours as needed. - Call for any changes  Seborrheic Keratoses - Stuck-on, waxy, tan-brown papules and/or plaques  - Benign-appearing - Discussed benign etiology and prognosis. - Observe - Call for any changes  Melanocytic Nevi - Tan-brown and/or pink-flesh-colored symmetric macules and papules - Benign appearing on exam today - Observation - Call clinic for new or changing moles - Recommend daily use of broad spectrum spf 30+ sunscreen to sun-exposed areas.   Hemangiomas - Red papules -  Discussed benign nature - Observe - Call for any changes  Actinic Damage - Chronic condition, secondary to cumulative UV/sun exposure - diffuse scaly erythematous macules with underlying dyspigmentation - Recommend daily broad spectrum sunscreen SPF 30+ to sun-exposed areas,  reapply every 2 hours as needed.  - Staying in the shade or wearing long sleeves, sun glasses (UVA+UVB protection) and wide brim hats (4-inch brim around the entire circumference of the hat) are also recommended for sun protection.  - Call for new or changing lesions.  Skin cancer screening performed today.  History of Basal Cell Carcinoma of the Skin - No evidence of recurrence today - Recommend regular full body skin exams - Recommend daily broad spectrum sunscreen SPF 30+ to sun-exposed areas, reapply every 2 hours as needed.  - Call if any new or changing lesions are noted between office visits  Purpura - Chronic; persistent and recurrent.  Treatable, but not curable. - Violaceous macules and patches - Benign - Related to trauma, age, sun damage and/or use of blood thinners, chronic use of topical and/or oral steroids - Observe - Can use OTC arnica containing moisturizer such as Dermend Bruise Formula if desired - Call for worsening or other concerns  History of Basal Cell Carcinoma of the Skin - No evidence of recurrence today - Recommend regular full body skin exams - Recommend daily broad spectrum sunscreen SPF 30+ to sun-exposed areas, reapply every 2 hours as needed.  - Call if any new or changing lesions are noted between office visits  Return in about 6 months (around 06/27/2021) for AK follow up.  Graciella Belton, RMA, am acting as scribe for [name], MD . Documentation: I have reviewed the above documentation for accuracy and completeness, and I agree with the above.  [name], MD

## 2021-01-03 ENCOUNTER — Encounter: Payer: Self-pay | Admitting: Dermatology

## 2021-01-05 ENCOUNTER — Telehealth: Payer: Self-pay

## 2021-01-05 NOTE — Telephone Encounter (Signed)
-----   Message from Ralene Bathe, MD sent at 12/30/2020  6:02 PM EST ----- Diagnosis Skin , left medial cheek lateral nose ACTINIC KERATOSIS AND SEBORRHEIC KERATOSIS  PreCancer with benign keratosis Recheck next visit and treat with LN2 if needed

## 2021-01-05 NOTE — Telephone Encounter (Signed)
Advised patient of results/hd  

## 2021-02-24 ENCOUNTER — Other Ambulatory Visit: Payer: Self-pay | Admitting: Dermatology

## 2021-06-27 ENCOUNTER — Other Ambulatory Visit: Payer: Self-pay

## 2021-06-27 ENCOUNTER — Emergency Department
Admission: EM | Admit: 2021-06-27 | Discharge: 2021-06-27 | Disposition: A | Discharge status: Home / Self Care | Payer: Medicare HMO | Attending: Emergency Medicine | Admitting: Emergency Medicine

## 2021-06-27 DIAGNOSIS — I498 Other specified cardiac arrhythmias: Secondary | ICD-10-CM

## 2021-06-27 DIAGNOSIS — R008 Other abnormalities of heart beat: Secondary | ICD-10-CM | POA: Insufficient documentation

## 2021-06-27 DIAGNOSIS — R001 Bradycardia, unspecified: Secondary | ICD-10-CM

## 2021-06-27 DIAGNOSIS — R009 Unspecified abnormalities of heart beat: Secondary | ICD-10-CM | POA: Diagnosis present

## 2021-06-27 DIAGNOSIS — I10 Essential (primary) hypertension: Secondary | ICD-10-CM | POA: Insufficient documentation

## 2021-06-27 LAB — BASIC METABOLIC PANEL
Anion gap: 7 (ref 5–15)
BUN: 18 mg/dL (ref 8–23)
CO2: 28 mmol/L (ref 22–32)
Calcium: 9.8 mg/dL (ref 8.9–10.3)
Chloride: 106 mmol/L (ref 98–111)
Creatinine, Ser: 1.27 mg/dL — ABNORMAL HIGH (ref 0.61–1.24)
GFR, Estimated: 60 mL/min — ABNORMAL LOW (ref >60)
Glucose, Bld: 90 mg/dL (ref 70–99)
Potassium: 4 mmol/L (ref 3.5–5.1)
Sodium: 141 mmol/L (ref 135–145)

## 2021-06-27 LAB — CBC WITH DIFFERENTIAL/PLATELET
Abs Immature Granulocytes: 0.03 10*3/uL (ref 0.00–0.07)
Basophils Absolute: 0.1 10*3/uL (ref 0.0–0.1)
Basophils Relative: 1 %
Eosinophils Absolute: 0.2 10*3/uL (ref 0.0–0.5)
Eosinophils Relative: 3 %
HCT: 44.2 % (ref 39.0–52.0)
Hemoglobin: 14.7 g/dL (ref 13.0–17.0)
Immature Granulocytes: 1 %
Lymphocytes Relative: 36 %
Lymphs Abs: 2 10*3/uL (ref 0.7–4.0)
MCH: 31.3 pg (ref 26.0–34.0)
MCHC: 33.3 g/dL (ref 30.0–36.0)
MCV: 94 fL (ref 80.0–100.0)
Monocytes Absolute: 0.5 10*3/uL (ref 0.1–1.0)
Monocytes Relative: 9 %
Neutro Abs: 2.9 10*3/uL (ref 1.7–7.7)
Neutrophils Relative %: 50 %
Platelets: 194 10*3/uL (ref 150–400)
RBC: 4.7 MIL/uL (ref 4.22–5.81)
RDW: 13.6 % (ref 11.5–15.5)
WBC: 5.6 10*3/uL (ref 4.0–10.5)
nRBC: 0 % (ref 0.0–0.2)

## 2021-06-27 LAB — MAGNESIUM: Magnesium: 2.4 mg/dL (ref 1.7–2.4)

## 2021-06-27 NOTE — ED Provider Notes (Signed)
? ?[facility] ?Provider Note ? ? ? Event Date/Time  ? First MD Initiated Contact with Patient 06/27/21 2010   ?  (approximate) ? ? ?History  ? ?Chief Complaint ?Irregular Heart Beat ? ? ?HPI ? ?PETRA SARGEANT is a 71 y.o. male with past medical history of hypertension, hyperlipidemia, alcohol abuse, and TIA who presents to the ED complaining of irregular heartbeat.  Patient reports that he was checking his blood pressure this evening like he usually does once per week, at that time found his heart rate to be low.  Patient states that he found it to be as low as 38 bpm, subsequently went to the fire department to have an EKG and was told to come to the ED for further evaluation.  He states that he feels well and denies any dizziness, lightheadedness, chest pain, or shortness of breath.  He does state that it occasionally feels like his heart skips a beat but he denies any sensation of his heart racing.  He has never been told he has an irregular rhythm in the past and he denies any cardiac history. ?  ? ? ?Physical Exam  ? ?Triage Vital Signs: ?ED Triage Vitals  ?Enc Vitals Group  ?   BP 06/27/21 2009 (!) 187/99  ?   Pulse Rate 06/27/21 2009 (!) 58  ?   Resp 06/27/21 2009 17  ?   Temp 06/27/21 2009 98.4 ?F (36.9 ?C)  ?   Temp Source 06/27/21 2009 Oral  ?   SpO2 06/27/21 2009 95 %  ?   Weight 06/27/21 2010 176 lb (79.8 kg)  ?   Height 06/27/21 2010 '5\' 10"'$  (1.778 m)  ?   Head Circumference --   ?   Peak Flow --   ?   Pain Score 06/27/21 2009 0  ?   Pain Loc --   ?   Pain Edu? --   ?   Excl. in [place]? --   ? ? ?Most recent vital signs: ?Vitals:  ? 06/27/21 2035 06/27/21 2100  ?BP: (!) 166/89 (!) 150/95  ?Pulse: 64 (!) 56  ?Resp: 10 16  ?Temp:    ?SpO2: 100% 98%  ? ? ?Constitutional: Alert and oriented. ?Eyes: Conjunctivae are normal. ?Head: Atraumatic. ?Nose: No congestion/rhinnorhea. ?Mouth/Throat: Mucous membranes are moist.  ?Cardiovascular: Bradycardic, irregularly irregular rhythm. Grossly normal  heart sounds.  2+ radial pulses bilaterally. ?Respiratory: Normal respiratory effort.  No retractions. Lungs CTAB. ?Gastrointestinal: Soft and nontender. No distention. ?Musculoskeletal: No lower extremity tenderness nor edema.  ?Neurologic:  Normal speech and language. No gross focal neurologic deficits are appreciated. ? ? ? ?ED Results / Procedures / Treatments  ? ?Labs ?(all labs ordered are listed, but only abnormal results are displayed) ?Labs Reviewed  ?BASIC METABOLIC PANEL - Abnormal; Notable for the following components:  ?    Result Value  ? Creatinine, Ser 1.27 (*)   ? GFR, Estimated 60 (*)   ? All other components within normal limits  ?CBC WITH DIFFERENTIAL/PLATELET  ?MAGNESIUM  ? ? ? ?EKG ? ?ED ECG REPORT ?Tempie Hoist, the attending physician, personally viewed and interpreted this ECG. ? ? Date: 06/27/2021 ? EKG Time: 20:11 ? Rate: 71 ? Rhythm: normal sinus rhythm, ventricular bigeminy ? Axis: Normal ? Intervals:none ? ST&T Change: None ? ?PROCEDURES: ? ?Critical Care performed: No ? ?Procedures ? ? ?MEDICATIONS ORDERED IN ED: ?Medications - No data to display ? ? ?IMPRESSION / MDM / ASSESSMENT AND PLAN /  ED COURSE  ?I reviewed the triage vital signs and the nursing notes. ?             ?               ? ?71 y.o. male with past medical history of hypertension, hyperlipidemia, alcohol abuse, and TIA who presents to the ED for low heart rate noted on regular check of his vital signs this evening, does admit occasional sensation of skipped beats but denies chest pain, lightheadedness, or shortness of breath. ? ?Differential diagnosis includes, but is not limited to, arrhythmia, ACS, electrolyte abnormality, PVCs. ? ?Patient well-appearing and in no acute distress, vital signs remarkable for mild bradycardia with stable blood pressure.  EKG shows ventricular bigeminy with no ischemic changes and patient denies any symptoms concerning for ischemia.  Labs are reassuring with CBC showing no anemia or  leukocytosis, BMP without AKI or electrolyte abnormality, magnesium level within normal limits.  Case discussed with Dr. Quentin Ore of cardiology, who agrees with plan for outpatient cardiology follow-up and we will continue his bisoprolol for now to help suppress PVCs.  Patient was counseled to return to the ED for new worsening symptoms, patient agrees with plan. ? ?  ? ? ?FINAL CLINICAL IMPRESSION(S) / ED DIAGNOSES  ? ?Final diagnoses:  ?Ventricular bigeminy  ?Bradycardia  ? ? ? ?Rx / DC Orders  ? ?ED Discharge Orders   ? ?      Ordered  ?  Ambulatory referral to Cardiology       ? 06/27/21 2122  ? ?  ?  ? ?  ? ? ? ?Note:  This document was prepared using Dragon voice recognition software and may include unintentional dictation errors. ?  ?Blake Divine, MD ?06/27/21 2128 ? ?

## 2021-06-27 NOTE — ED Triage Notes (Signed)
Pt was at home and takes blood pressure once a week. Pt took blood pressure tonight and noticed his heart rate was low. Pt brother manually checked pulse and got 38 beats a minute. Pt went to fire dept and had a ekg done and was told to come here.  ?

## 2021-06-27 NOTE — ED Notes (Signed)
Dr. Jessup at the bedside 

## 2021-07-06 ENCOUNTER — Other Ambulatory Visit: Payer: Self-pay | Admitting: Dermatology

## 2021-07-13 ENCOUNTER — Ambulatory Visit: Payer: Medicare HMO | Admitting: Dermatology

## 2021-07-15 ENCOUNTER — Ambulatory Visit: Payer: Medicare HMO | Admitting: Dermatology

## 2021-07-15 DIAGNOSIS — L409 Psoriasis, unspecified: Secondary | ICD-10-CM

## 2021-07-15 DIAGNOSIS — L578 Other skin changes due to chronic exposure to nonionizing radiation: Secondary | ICD-10-CM

## 2021-07-15 DIAGNOSIS — L820 Inflamed seborrheic keratosis: Secondary | ICD-10-CM | POA: Diagnosis not present

## 2021-07-15 DIAGNOSIS — L570 Actinic keratosis: Secondary | ICD-10-CM | POA: Diagnosis not present

## 2021-07-15 DIAGNOSIS — L821 Other seborrheic keratosis: Secondary | ICD-10-CM

## 2021-07-15 MED ORDER — CALCIPOTRIENE 0.005 % EX SOLN: 1.0000 "application " | CUTANEOUS | 6 refills | Status: DC

## 2021-07-15 NOTE — Patient Instructions (Addendum)
Discontinue Mometasone cream   Start calcipotiene solution 0.005 % apply to any scaly areas at scalp, face and ears daily.   Will recheck in 3 months     Actinic keratoses are precancerous spots that appear secondary to cumulative UV radiation exposure/sun exposure over time. They are chronic with expected duration over 1 year. A portion of actinic keratoses will progress to squamous cell carcinoma of the skin. It is not possible to reliably predict which spots will progress to skin cancer and so treatment is recommended to prevent development of skin cancer.  Recommend daily broad spectrum sunscreen SPF 30+ to sun-exposed areas, reapply every 2 hours as needed.  Recommend staying in the shade or wearing long sleeves, sun glasses (UVA+UVB protection) and wide brim hats (4-inch brim around the entire circumference of the hat). Call for new or changing lesions.     Cryotherapy Aftercare  Wash gently with soap and water everyday.   Apply Vaseline and Band-Aid daily until healed.   Seborrheic Keratosis  What causes seborrheic keratoses? Seborrheic keratoses are harmless, common skin growths that first appear during adult life.  As time goes by, more growths appear.  Some people may develop a large number of them.  Seborrheic keratoses appear on both covered and uncovered body parts.  They are not caused by sunlight.  The tendency to develop seborrheic keratoses can be inherited.  They vary in color from skin-colored to gray, brown, or even black.  They can be either smooth or have a rough, warty surface.   Seborrheic keratoses are superficial and look as if they were stuck on the skin.  Under the microscope this type of keratosis looks like layers upon layers of skin.  That is why at times the top layer may seem to fall off, but the rest of the growth remains and re-grows.    Treatment Seborrheic keratoses do not need to be treated, but can easily be removed in the office.  Seborrheic  keratoses often cause symptoms when they rub on clothing or jewelry.  Lesions can be in the way of shaving.  If they become inflamed, they can cause itching, soreness, or burning.  Removal of a seborrheic keratosis can be accomplished by freezing, burning, or surgery. If any spot bleeds, scabs, or grows rapidly, please return to have it checked, as these can be an indication of a skin cancer.  If You Need Anything After Your Visit  If you have any questions or concerns for your doctor, please call our main line at [phone] and press option 4 to reach your doctor's medical assistant. If no one answers, please leave a voicemail as directed and we will return your call as soon as possible. Messages left after 4 pm will be answered the following business day.   You may also send [place] a message via [place]. We typically respond to MyChart messages within 1-2 business days.     For prescription refills, please ask your pharmacy to contact our office. Our fax number is [phone].  If you have an urgent issue when the clinic is closed that cannot wait until the next business day, you can page your doctor at the number below.    Please note that while we do our best to be available for urgent issues outside of office hours, we are not available 24/7.   If you have an urgent issue and are unable to reach [place], you may choose to seek medical care at your doctor's office, retail clinic,  urgent care center, or emergency room.  If you have a medical emergency, please immediately call 911 or go to the emergency department.  Pager Numbers  - Dr. Nehemiah Massed: [phone]  - Dr. Laurence Ferrari: [phone]  - Dr. Nicole Kindred: [phone]  In the event of inclement weather, please call our main line at [phone] for an update on the status of any delays or closures.  Dermatology Medication Tips: Please keep the boxes that topical medications come in in order to help keep track of the instructions about where  and how to use these. Pharmacies typically print the medication instructions only on the boxes and not directly on the medication tubes.   If your medication is too expensive, please contact our office at [phone] option 4 or send [place] a message through [place].   We are unable to tell what your co-pay for medications will be in advance as this is different depending on your insurance coverage. However, we may be able to find a substitute medication at lower cost or fill out paperwork to get insurance to cover a needed medication.   If a prior authorization is required to get your medication covered by your insurance company, please allow [place] 1-2 business days to complete this process.  Drug prices often vary depending on where the prescription is filled and some pharmacies may offer cheaper prices.  The website www.goodrx.com contains coupons for medications through different pharmacies. The prices here do not account for what the cost may be with help from insurance (it may be cheaper with your insurance), but the website can give you the price if you did not use any insurance.  - You can print the associated coupon and take it with your prescription to the pharmacy.  - You may also stop by our office during regular business hours and pick up a GoodRx coupon card.  - If you need your prescription sent electronically to a different pharmacy, notify our office through [facility] or by phone at [phone] option 4.     Si Usted Necesita Algo Despus de Su Visita  Tambin puede enviarnos un mensaje a travs de Pharmacist, community. Por lo general respondemos a los mensajes de MyChart en el transcurso de 1 a 2 das hbiles.  Para renovar recetas, por favor pida a su farmacia que se ponga en contacto con nuestra oficina. Harland Dingwall de fax es [place] [phone].  Si tiene un asunto urgente cuando la clnica est cerrada y que no puede esperar hasta el siguiente da hbil, puede llamar/localizar a  su doctor(a) al nmero que aparece a continuacin.   Por favor, tenga en cuenta que aunque hacemos todo lo posible para estar disponibles para asuntos urgentes fuera del horario de [place], no estamos disponibles las 24 horas del da, los 7 das de la [place].   Si tiene un problema urgente y no puede comunicarse con nosotros, puede optar por buscar atencin mdica  en el consultorio de su doctor(a), en una clnica privada, en un centro de atencin urgente o en una sala de emergencias.  Si tiene Engineering geologist, por favor llame inmediatamente al 911 o vaya a la sala de emergencias.  Nmeros de bper  - Dr. Nehemiah Massed: [phone]  - Dra. Moye: [phone]  - Dra. Nicole Kindred: [phone]  En caso de inclemencias del [place], por favor llame a Johnsie Kindred principal al [phone] para una actualizacin sobre el [place] de cualquier retraso o cierre.  Consejos para la medicacin en dermatologa: Por favor, guarde las cajas  en las que vienen los medicamentos de uso tpico para ayudarle a seguir las instrucciones sobre dnde y cmo usarlos. Las farmacias generalmente imprimen las instrucciones del medicamento slo en las cajas y no directamente en los tubos del [place].   Si su medicamento es muy caro, por favor, pngase en contacto con Zigmund Daniel llamando al [phone] y presione la opcin 4 o envenos un mensaje a travs de Pharmacist, community.   No podemos decirle cul ser su copago por los medicamentos por adelantado ya que esto es diferente dependiendo de la cobertura de su seguro. Sin embargo, es posible que podamos encontrar un medicamento sustituto a Electrical engineer un formulario para que el seguro cubra el medicamento que se considera necesario.   Si se requiere una autorizacin previa para que su compaa de seguros [place] su medicamento, por favor permtanos de 1 a 2 das hbiles para completar este proceso.  Los precios de los medicamentos varan con frecuencia dependiendo  del Environmental consultant de dnde se surte la receta y alguna farmacias pueden ofrecer precios ms baratos.  El sitio web www.goodrx.com tiene cupones para medicamentos de Airline pilot. Los precios aqu no tienen en cuenta lo que podra costar con la ayuda del seguro (puede ser ms barato con su seguro), pero el sitio web puede darle el precio si no utiliz Research scientist (physical sciences).  - Puede imprimir el cupn correspondiente y llevarlo con su receta a la farmacia.  - Tambin puede pasar por nuestra oficina durante el horario de atencin regular y Charity fundraiser una tarjeta de cupones de GoodRx.  - Si necesita que su receta se enve electrnicamente a una farmacia diferente, informe a nuestra oficina a travs de MyChart de [place] o por telfono llamando al [phone] y presione la opcin 4.

## 2021-07-15 NOTE — Progress Notes (Signed)
Follow-Up Visit   Subjective  Scott Cochran is a 71 y.o. male who presents for the following: Actinic Keratosis (Hx of aks. Has a few places at scalp and face that are bothering him) and Psoriasis (Hx psoriasis . Patient has been using mometasone cream to aa' of scalp would like to discuss refills ). The patient has spots, moles and lesions to be evaluated, some may be new or changing and the patient has concerns that these could be cancer.  The following portions of the chart were reviewed this encounter and updated as appropriate:  Tobacco  Allergies  Meds  Problems  Med Hx  Surg Hx  Fam Hx     Review of Systems: No other skin or systemic complaints except as noted in HPI or Assessment and Plan.  Objective  Well appearing patient in no apparent distress; mood and affect are within normal limits.  A focused examination was performed including face, neck, scalp, ears, arms . Relevant physical exam findings are noted in the Assessment and Plan.  scalp x 4, face and ears x 19 (23) Erythematous thin papules/macules with gritty scale.   right neck x 2 , forehead x 1 (3) Erythematous stuck-on, waxy papule or plaque   Assessment & Plan  Psoriasis Scalp Psoriasis is a chronic non-curable, but treatable genetic/hereditary disease that may have other systemic features affecting other organ systems such as joints (Psoriatic Arthritis). It is associated with an increased risk of inflammatory bowel disease, heart disease, non-alcoholic fatty liver disease, and depression.     D/C Mometasone solution to the scalp. Start Calcipotriene 0.005 % solution - apply to any scaly area of scalp, face, ears, qd.   Will recheck in 3 months.  Chronic and persistent condition with duration or expected duration over one year. Condition is symptomatic / bothersome to patient. Not to goal.  Calcipotriene 0.005 % solution - Scalp Apply 1 application. topically as directed. Apply to any scaly areas of  scalp, face, ears daily  Actinic keratosis (23) scalp x 4, face and ears x 19 Actinic keratoses are precancerous spots that appear secondary to cumulative UV radiation exposure/sun exposure over time. They are chronic with expected duration over 1 year. A portion of actinic keratoses will progress to squamous cell carcinoma of the skin. It is not possible to reliably predict which spots will progress to skin cancer and so treatment is recommended to prevent development of skin cancer.  Recommend daily broad spectrum sunscreen SPF 30+ to sun-exposed areas, reapply every 2 hours as needed.  Recommend staying in the shade or wearing long sleeves, sun glasses (UVA+UVB protection) and wide brim hats (4-inch brim around the entire circumference of the hat). Call for new or changing lesions.  Destruction of lesion - scalp x 4, face and ears x 19 Complexity: simple   Destruction method: cryotherapy   Informed consent: discussed and consent obtained   Timeout:  patient name, date of birth, surgical site, and procedure verified Lesion destroyed using liquid nitrogen: Yes   Region frozen until ice ball extended beyond lesion: Yes   Outcome: patient tolerated procedure well with no complications   Post-procedure details: wound care instructions given   Additional details:  Prior to procedure, discussed risks of blister formation, small wound, skin dyspigmentation, or rare scar following cryotherapy. Recommend Vaseline ointment to treated areas while healing.  Inflamed seborrheic keratosis (3) right neck x 2 , forehead x 1 Symptomatic, irritating, patient would like treated.  Destruction of lesion -  right neck x 2 , forehead x 1 Complexity: simple   Destruction method: cryotherapy   Informed consent: discussed and consent obtained   Timeout:  patient name, date of birth, surgical site, and procedure verified Lesion destroyed using liquid nitrogen: Yes   Region frozen until ice ball extended beyond  lesion: Yes   Outcome: patient tolerated procedure well with no complications   Post-procedure details: wound care instructions given   Additional details:  Prior to procedure, discussed risks of blister formation, small wound, skin dyspigmentation, or rare scar following cryotherapy. Recommend Vaseline ointment to treated areas while healing.  Seborrheic Keratoses - Stuck-on, waxy, tan-brown papules and/or plaques  - Benign-appearing - Discussed benign etiology and prognosis. - Observe - Call for any changes  Actinic Damage - chronic, secondary to cumulative UV radiation exposure/sun exposure over time - diffuse scaly erythematous macules with underlying dyspigmentation - Recommend daily broad spectrum sunscreen SPF 30+ to sun-exposed areas, reapply every 2 hours as needed.  - Recommend staying in the shade or wearing long sleeves, sun glasses (UVA+UVB protection) and wide brim hats (4-inch brim around the entire circumference of the hat). - Call for new or changing lesions.  Return in about 3 months (around 10/15/2021) for ak followup/ psoriasis . IRuthell Rummage, CMA, am acting as scribe for [name], MD. Documentation: I have reviewed the above documentation for accuracy and completeness, and I agree with the above.  [name], MD

## 2021-07-25 ENCOUNTER — Encounter: Payer: Self-pay | Admitting: Dermatology

## 2021-08-10 NOTE — Progress Notes (Signed)
Electrophysiology Office Note:    Date:  08/11/2021   ID:  Scott Cochran, DOB 1950/02/26, MRN [id number]  PCP:  Juluis Pitch, MD  [facility] Cardiologist:  None  [facility] Electrophysiologist:  Vickie Epley, MD   Referring MD: Juluis Pitch, MD   Chief Complaint: PVC  History of Present Illness:    Scott Cochran is a 71 y.o. male who presents for an evaluation of PVC at the request of Dr Lovie Macadamia. Their medical history includes HTN, HLD, EtOH abuse and TIA. He last was in the ER 06/27/2021 with bigeminy. His HR was 38 on his home BP monitor. He then went to the fire department for an ECG. It was recommended that he present to the ER for further evaluation. He takes bisoprolol at home.  Patient tells me he has not had any syncope or presyncope.  He did not really appreciate the palpitations or PVCs when they were occurring and only noted them because of his blood pressure cuff giving a very low pulse reading.    Past Medical History:  Diagnosis Date   Actinic keratosis    Anxiety    BPH (benign prostatic hyperplasia)    Fracture    left arm   Hearing loss    History of kidney stones    Hx of basal cell carcinoma 02/23/2016   R infranasal medial to superior nasolabial   Hypercholesterolemia    Hypertension    Meniere's disease     Past Surgical History:  Procedure Laterality Date   COLONOSCOPY     COLONOSCOPY WITH PROPOFOL N/A 05/24/2019   Procedure: COLONOSCOPY WITH PROPOFOL;  Surgeon: Robert Bellow, MD;  Location: [facility] ENDOSCOPY;  Service: Endoscopy;  Laterality: N/A;  TRAVEL CASE TO O.R.   HEMORRHOID SURGERY     HERNIA REPAIR Right    INGUINAL HERNIA REPAIR Right 05/24/2019   Procedure: HERNIA REPAIR INGUINAL ADULT;  Surgeon: Robert Bellow, MD;  Location: [facility] ORS;  Service: General;  Laterality: Right;  patient will be coming from endo    Current Medications: Current Meds  Medication Sig   acetaminophen (TYLENOL) 500 MG tablet Take 1,000 mg by  mouth every 6 (six) hours as needed for moderate pain.   aspirin EC 81 MG EC tablet Take 1 tablet (81 mg total) by mouth daily.   bisoprolol-hydrochlorothiazide (ZIAC) 2.5-6.25 MG tablet Take 1 tablet by mouth daily.   Calcipotriene 0.005 % solution Apply 1 application. topically as directed. Apply to any scaly areas of scalp, face, ears daily   cetirizine (ZYRTEC) 5 MG tablet Take 5 mg by mouth daily.   diazepam (VALIUM) 5 MG tablet Take 5 mg by mouth 2 (two) times daily as needed for anxiety.   docusate sodium (COLACE) 100 MG capsule Take 100 mg by mouth daily.   fluorouracil (EFUDEX) 5 % cream Apply 0.005 mcg topically in the morning and at bedtime.   naproxen sodium (ALEVE) 220 MG tablet Take 220 mg by mouth daily as needed.   silodosin (RAPAFLO) 8 MG CAPS capsule Take by mouth.   simvastatin (ZOCOR) 20 MG tablet Take 20 mg by mouth daily.      Allergies:   Sulfa antibiotics   Social History   Socioeconomic History   Marital status: Widowed    Spouse name: Not on file   Number of children: Not on file   Years of education: Not on file   Highest education level: Not on file  Occupational History   Not  on file  Tobacco Use   Smoking status: Former   Smokeless tobacco: Never   Tobacco comments:    35 years ago  Vaping Use   Vaping Use: Never used  Substance and Sexual Activity   Alcohol use: Yes    Comment: rarely   Drug use: No   Sexual activity: Not on file  Other Topics Concern   Not on file  Social History Narrative   Not on file   Social Determinants of Health   Financial Resource Strain: Not on file  Food Insecurity: Not on file  Transportation Needs: Not on file  Physical Activity: Not on file  Stress: Not on file  Social Connections: Not on file     Family History: The patient's family history includes Diabetes Mellitus II in his father.  ROS:   Please see the history of present illness.    All other systems reviewed and are  negative.  EKGs/Labs/Other Studies Reviewed:    The following studies were reviewed today:  05/26/2019 Echo EF 60% RV mildly reduced LA mildly dilated Mild MR  06/28/2021 ECG  Bigeminal PVC, monomorphic. Steeply inferior axis with precordial transition in V3.    EKG:  The ekg ordered today demonstrates sinus rhythm.  No PVCs.   Recent Labs: 06/27/2021: BUN 18; Creatinine, Ser 1.27; Hemoglobin 14.7; Magnesium 2.4; Platelets 194; Potassium 4.0; Sodium 141  Recent Lipid Panel    Component Value Date/Time   CHOL 156 05/26/2019 0806   TRIG 117 05/26/2019 0806   HDL 39 (L) 05/26/2019 0806   CHOLHDL 4.0 05/26/2019 0806   VLDL 23 05/26/2019 0806   LDLCALC 94 05/26/2019 0806    Physical Exam:    VS:  BP (!) 135/94   Pulse (!) 57   Ht '5\' 9"'$  (1.753 m)   Wt 179 lb (81.2 kg)   SpO2 99%   BMI 26.43 kg/m     Wt Readings from Last 3 Encounters:  08/11/21 179 lb (81.2 kg)  06/27/21 176 lb (79.8 kg)  05/25/19 186 lb (84.4 kg)     GEN:  Well nourished, well developed in no acute distress HEENT: Normal NECK: No JVD; No carotid bruits LYMPHATICS: No lymphadenopathy CARDIAC: RRR, no murmurs, rubs, gallops RESPIRATORY:  Clear to auscultation without rales, wheezing or rhonchi  ABDOMEN: Soft, non-tender, non-distended MUSCULOSKELETAL:  No edema; No deformity  SKIN: Warm and dry NEUROLOGIC:  Alert and oriented x 3 PSYCHIATRIC:  Normal affect       ASSESSMENT:    1. PVC (premature ventricular contraction)   2. Primary hypertension    PLAN:    In order of problems listed above:  #PVCs No PVCs today but had a 50% burden on the most recent twelve-lead.  Asymptomatic.  No syncope or presyncope. Zio monitor 2 week Echo  #Hypertension Controlled today Continue current medical therapy Check blood pressures 1-2 times per week at home and record these values.  Bring these values to your primary care physician for further medication regimen titration.  Follow-up in 6 to 8  weeks to review results.   Note sent to primary care physician.   Medication Adjustments/Labs and Tests Ordered: Current medicines are reviewed at length with the patient today.  Concerns regarding medicines are outlined above.  No orders of the defined types were placed in this encounter.  No orders of the defined types were placed in this encounter.    Signed, Hilton Cork. Quentin Ore, MD, [facility], [facility] 08/11/2021 10:36 AM    Electrophysiology  [facility]

## 2021-08-11 ENCOUNTER — Encounter: Payer: Self-pay | Admitting: Cardiology

## 2021-08-11 ENCOUNTER — Ambulatory Visit (INDEPENDENT_AMBULATORY_CARE_PROVIDER_SITE_OTHER): Payer: Medicare HMO

## 2021-08-11 ENCOUNTER — Ambulatory Visit: Payer: Medicare HMO | Admitting: Cardiology

## 2021-08-11 VITALS — BP 135/94 | HR 57 | Ht 69.0 in | Wt 179.0 lb

## 2021-08-11 DIAGNOSIS — I10 Essential (primary) hypertension: Secondary | ICD-10-CM

## 2021-08-11 DIAGNOSIS — I493 Ventricular premature depolarization: Secondary | ICD-10-CM

## 2021-08-11 NOTE — Patient Instructions (Addendum)
Medications: Your physician recommends that you continue on your current medications as directed. Please refer to the Current Medication list given to you today. *If you need a refill on your cardiac medications before your next appointment, please call your pharmacy*  Lab Work: None. If you have labs (blood work) drawn today and your tests are completely normal, you will receive your results only by: [place] (if you have MyChart) OR A paper copy in the mail If you have any lab test that is abnormal or we need to change your treatment, we will call you to review the results.  Testing/Procedures: Your physician has requested that you have an echocardiogram. Echocardiography is a painless test that uses sound waves to create images of your heart. It provides your doctor with information about the size and shape of your heart and how well your heart's chambers and valves are working. This procedure takes approximately one hour. There are no restrictions for this procedure.  Your physician has recommended that you wear a heart monitor. Heart monitors are medical devices that record the heart's electrical activity. Doctors most often use these monitors to diagnose arrhythmias. Arrhythmias are problems with the speed or rhythm of the heartbeat. The monitor is a small, portable device. You can wear one while you do your normal daily activities. This is usually used to diagnose what is causing palpitations/syncope (passing out).   Follow-Up: At [facility], you and your health needs are our priority.  As part of our continuing mission to provide you with exceptional heart care, we have created designated Provider Care Teams.  These Care Teams include your primary Cardiologist (physician) and Advanced Practice Providers (APPs -  Physician Assistants and Nurse Practitioners) who all work together to provide you with the care you need, when you need it.  Your physician wants you to follow-up in:  6-8 weeks with Scott Mage, MD   We recommend signing up for the patient portal called "MyChart".  Sign up information is provided on this After Visit Summary.  MyChart is used to connect with patients for Virtual Visits (Telemedicine).  Patients are able to view lab/test results, encounter notes, upcoming appointments, etc.  Non-urgent messages can be sent to your provider as well.   To learn more about what you can do with MyChart, go to [email or web address].    Any Other Special Instructions Will Be Listed Below (If Applicable).  ZIO XT- Long Term Monitor Instructions  Your physician has requested you wear a ZIO patch monitor for 14 days.  This is a single patch monitor. [employer] supplies one patch monitor per enrollment. Additional stickers are not available. Please do not apply patch if you will be having a Nuclear Stress Test,  Echocardiogram, Cardiac CT, MRI, or Chest Xray during the period you would be wearing the  monitor. The patch cannot be worn during these tests. You cannot remove and re-apply the  ZIO XT patch monitor.  Your ZIO patch monitor will be mailed 3 day USPS to your address on file. It may take 3-5 days  to receive your monitor after you have been enrolled.  Once you have received your monitor, please review the enclosed instructions. Your monitor  has already been registered assigning a specific monitor serial # to you.  Billing and Patient Assistance Program Information  We have supplied [employer] with any of your insurance information on file for billing purposes. [employer] offers a sliding scale Patient Assistance Program for patients that do not have  insurance,  or whose insurance does not completely cover the cost of the ZIO monitor.  You must apply for the Patient Assistance Program to qualify for this discounted rate.  To apply, please call [employer] at [phone], select option 4, select option 2, ask to apply for  Patient Assistance Program. Scott Cochran will  ask your household income, and how many people  are in your household. They will quote your out-of-pocket cost based on that information.  [employer] will also be able to set up a 22-month interest-free payment plan if needed.  Applying the monitor   Shave hair from upper left chest.  Hold abrader disc by orange tab. Rub abrader in 40 strokes over the upper left chest as  indicated in your monitor instructions.  Clean area with 4 enclosed alcohol pads. Let dry.  Apply patch as indicated in monitor instructions. Patch will be placed under collarbone on left  side of chest with arrow pointing upward.  Rub patch adhesive wings for 2 minutes. Remove white label marked "1". Remove the white  label marked "2". Rub patch adhesive wings for 2 additional minutes.  While looking in a mirror, press and release button in center of patch. A small green light will  flash 3-4 times. This will be your only indicator that the monitor has been turned on.  Do not shower for the first 24 hours. You may shower after the first 24 hours.  Press the button if you feel a symptom. You will hear a small click. Record Date, Time and  Symptom in the Patient Logbook.  When you are ready to remove the patch, follow instructions on the last 2 pages of Patient  Logbook. Stick patch monitor onto the last page of Patient Logbook.  Place Patient Logbook in the blue and white box. Use locking tab on box and tape box closed  securely. The blue and white box has prepaid postage on it. Please place it in the mailbox as  soon as possible. Your physician should have your test results approximately 7 days after the  monitor has been mailed back to [facility]  Call IWrightat [phone] you have questions regarding  your ZIO XT patch monitor. Call them immediately if you see an orange light blinking on your  monitor.  If your monitor falls off in less than 4 days, contact our Monitor department at  [phone]  If your monitor becomes loose or falls off after 4 days call [employer] at [phone]  suggestions on securing your monitor

## 2021-08-14 DIAGNOSIS — I493 Ventricular premature depolarization: Secondary | ICD-10-CM | POA: Diagnosis not present

## 2021-08-14 DIAGNOSIS — I10 Essential (primary) hypertension: Secondary | ICD-10-CM

## 2021-09-08 ENCOUNTER — Ambulatory Visit (INDEPENDENT_AMBULATORY_CARE_PROVIDER_SITE_OTHER): Payer: Medicare HMO

## 2021-09-08 DIAGNOSIS — I493 Ventricular premature depolarization: Secondary | ICD-10-CM | POA: Diagnosis not present

## 2021-09-08 DIAGNOSIS — I10 Essential (primary) hypertension: Secondary | ICD-10-CM

## 2021-09-08 LAB — ECHOCARDIOGRAM COMPLETE
AR max vel: 2.55 cm2
AV Area VTI: 2.75 cm2
AV Area mean vel: 2.44 cm2
AV Mean grad: 3 mmHg
AV Peak grad: 5.9 mmHg
Ao pk vel: 1.21 m/s
Area-P 1/2: 2.16 cm2
Calc EF: 60.5 %
P 1/2 time: 1680 msec
S' Lateral: 3.4 cm
Single Plane A2C EF: 57.5 %
Single Plane A4C EF: 65.9 %

## 2021-09-09 ENCOUNTER — Other Ambulatory Visit: Payer: Medicare HMO

## 2021-10-11 ENCOUNTER — Other Ambulatory Visit: Payer: Self-pay | Admitting: Family Medicine

## 2021-10-11 DIAGNOSIS — M5416 Radiculopathy, lumbar region: Secondary | ICD-10-CM

## 2021-10-19 NOTE — Progress Notes (Unsigned)
Electrophysiology Office Follow up Visit Note:    Date:  10/20/2021   ID:  Scott Cochran, DOB Oct 22, 1950, MRN [id number]  PCP:  Juluis Pitch, MD  [facility] Cardiologist:  None  [facility] Electrophysiologist:  Vickie Epley, MD    Interval History:    Scott Cochran is a 71 y.o. male who presents for a follow up visit. They were last seen in clinic August 11, 2021 for frequent PVCs.  The patient's PVCs were detected when a heart rate monitor read low and he was found to be in a bigeminal rhythm.  At the last appointment I ordered an echo and 2-week ZIO monitor.  Today he is doing well.  No complaints.  He checks his blood pressures at home.  He tells me after strenuous work he has had a couple lower blood pressure readings around 967 mmHg systolic.      Past Medical History:  Diagnosis Date   Actinic keratosis    Anxiety    BPH (benign prostatic hyperplasia)    Fracture    left arm   Hearing loss    History of kidney stones    Hx of basal cell carcinoma 02/23/2016   R infranasal medial to superior nasolabial   Hypercholesterolemia    Hypertension    Meniere's disease     Past Surgical History:  Procedure Laterality Date   COLONOSCOPY     COLONOSCOPY WITH PROPOFOL N/A 05/24/2019   Procedure: COLONOSCOPY WITH PROPOFOL;  Surgeon: Robert Bellow, MD;  Location: [facility] ENDOSCOPY;  Service: Endoscopy;  Laterality: N/A;  TRAVEL CASE TO O.R.   HEMORRHOID SURGERY     HERNIA REPAIR Right    INGUINAL HERNIA REPAIR Right 05/24/2019   Procedure: HERNIA REPAIR INGUINAL ADULT;  Surgeon: Robert Bellow, MD;  Location: [facility] ORS;  Service: General;  Laterality: Right;  patient will be coming from endo    Current Medications: Current Meds  Medication Sig   acetaminophen (TYLENOL) 500 MG tablet Take 1,000 mg by mouth every 6 (six) hours as needed for moderate pain.   aspirin EC 81 MG EC tablet Take 1 tablet (81 mg total) by mouth daily.   bisoprolol-hydrochlorothiazide  (ZIAC) 2.5-6.25 MG tablet Take 1 tablet by mouth daily.   Calcipotriene 0.005 % solution Apply 1 application. topically as directed. Apply to any scaly areas of scalp, face, ears daily   cetirizine (ZYRTEC) 5 MG tablet Take 5 mg by mouth daily.   diazepam (VALIUM) 5 MG tablet Take 5 mg by mouth 2 (two) times daily as needed for anxiety.   docusate sodium (COLACE) 100 MG capsule Take 100 mg by mouth daily.   fluorouracil (EFUDEX) 5 % cream Apply 0.005 mcg topically in the morning and at bedtime.   naproxen sodium (ALEVE) 220 MG tablet Take 220 mg by mouth daily as needed.   silodosin (RAPAFLO) 8 MG CAPS capsule Take by mouth.   simvastatin (ZOCOR) 20 MG tablet Take 20 mg by mouth daily.      Allergies:   Sulfa antibiotics   Social History   Socioeconomic History   Marital status: Widowed    Spouse name: Not on file   Number of children: Not on file   Years of education: Not on file   Highest education level: Not on file  Occupational History   Not on file  Tobacco Use   Smoking status: Former   Smokeless tobacco: Never   Tobacco comments:    35 years ago  Vaping Use   Vaping Use: Never used  Substance and Sexual Activity   Alcohol use: Yes    Comment: rarely   Drug use: No   Sexual activity: Not on file  Other Topics Concern   Not on file  Social History Narrative   Not on file   Social Determinants of Health   Financial Resource Strain: Not on file  Food Insecurity: Not on file  Transportation Needs: Not on file  Physical Activity: Not on file  Stress: Not on file  Social Connections: Not on file     Family History: The patient's family history includes Diabetes Mellitus II in his father.  ROS:   Please see the history of present illness.    All other systems reviewed and are negative.  EKGs/Labs/Other Studies Reviewed:    The following studies were reviewed today:  September 08, 2021 echo EF normal, 55% RV function normal Atria dilated No MR No AAS Mild  dilation of the ascending aorta  September 04, 2021 ZIO monitor Rare PVCs   Recent Labs: 06/27/2021: BUN 18; Creatinine, Ser 1.27; Hemoglobin 14.7; Magnesium 2.4; Platelets 194; Potassium 4.0; Sodium 141  Recent Lipid Panel    Component Value Date/Time   CHOL 156 05/26/2019 0806   TRIG 117 05/26/2019 0806   HDL 39 (L) 05/26/2019 0806   CHOLHDL 4.0 05/26/2019 0806   VLDL 23 05/26/2019 0806   LDLCALC 94 05/26/2019 0806    Physical Exam:    VS:  BP 130/78   Pulse (!) 48   Ht '5\' 9"'$  (1.753 m)   Wt 174 lb (78.9 kg)   SpO2 98%   BMI 25.70 kg/m     Wt Readings from Last 3 Encounters:  10/20/21 174 lb (78.9 kg)  08/11/21 179 lb (81.2 kg)  06/27/21 176 lb (79.8 kg)     GEN:  Well nourished, well developed in no acute distress HEENT: Normal NECK: No JVD; No carotid bruits LYMPHATICS: No lymphadenopathy CARDIAC: RRR, no murmurs, rubs, gallops RESPIRATORY:  Clear to auscultation without rales, wheezing or rhonchi  ABDOMEN: Soft, non-tender, non-distended MUSCULOSKELETAL:  No edema; No deformity  SKIN: Warm and dry NEUROLOGIC:  Alert and oriented x 3 PSYCHIATRIC:  Normal affect        ASSESSMENT:    1. Primary hypertension   2. PVC (premature ventricular contraction)   3. TIA (transient ischemic attack)    PLAN:    In order of problems listed above:  #Hypertension Recheck today in range at 130/78.  Continue current medications.  Recommend checking blood pressure 1-2 times per week at home and follow-up with your primary care physician to review these readings.  #PVCs Doing well on bisoprolol.  Recommend continuing this therapy.  Thankfully, low burden of PVCs and he is asymptomatic.   Follow-up with EP on an as-needed basis.   Medication Adjustments/Labs and Tests Ordered: Current medicines are reviewed at length with the patient today.  Concerns regarding medicines are outlined above.  No orders of the defined types were placed in this encounter.  No orders of  the defined types were placed in this encounter.    Signed, Lars Mage, MD, [facility], [facility] 10/20/2021 10:11 AM    Electrophysiology [facility]

## 2021-10-20 ENCOUNTER — Ambulatory Visit: Payer: Medicare HMO | Attending: Cardiology | Admitting: Cardiology

## 2021-10-20 ENCOUNTER — Encounter: Payer: Self-pay | Admitting: Cardiology

## 2021-10-20 VITALS — BP 130/78 | HR 48 | Ht 69.0 in | Wt 174.0 lb

## 2021-10-20 DIAGNOSIS — I10 Essential (primary) hypertension: Secondary | ICD-10-CM | POA: Diagnosis not present

## 2021-10-20 DIAGNOSIS — I493 Ventricular premature depolarization: Secondary | ICD-10-CM

## 2021-10-20 DIAGNOSIS — G459 Transient cerebral ischemic attack, unspecified: Secondary | ICD-10-CM

## 2021-10-20 NOTE — Patient Instructions (Signed)
Medication Instructions:  none *If you need a refill on your cardiac medications before your next appointment, please call your pharmacy*   Lab Work: none If you have labs (blood work) drawn today and your tests are completely normal, you will receive your results only by: [place] (if you have MyChart) OR A paper copy in the mail If you have any lab test that is abnormal or we need to change your treatment, we will call you to review the results.   Testing/Procedures: none   Follow-Up: At [facility], you and your health needs are our priority.  As part of our continuing mission to provide you with exceptional heart care, we have created designated Provider Care Teams.  These Care Teams include your primary Cardiologist (physician) and Advanced Practice Providers (APPs -  Physician Assistants and Nurse Practitioners) who all work together to provide you with the care you need, when you need it.  We recommend signing up for the patient portal called "MyChart".  Sign up information is provided on this After Visit Summary.  MyChart is used to connect with patients for Virtual Visits (Telemedicine).  Patients are able to view lab/test results, encounter notes, upcoming appointments, etc.  Non-urgent messages can be sent to your provider as well.   To learn more about what you can do with MyChart, go to [email or web address].    Your next appointment:   As needed    Provider:   Lars Mage, MD    Other Instructions none  Important Information About Sugar

## 2021-10-27 ENCOUNTER — Ambulatory Visit: Payer: Medicare HMO | Admitting: Dermatology

## 2021-10-27 DIAGNOSIS — L309 Dermatitis, unspecified: Secondary | ICD-10-CM | POA: Diagnosis not present

## 2021-10-27 DIAGNOSIS — L578 Other skin changes due to chronic exposure to nonionizing radiation: Secondary | ICD-10-CM | POA: Diagnosis not present

## 2021-10-27 DIAGNOSIS — L409 Psoriasis, unspecified: Secondary | ICD-10-CM

## 2021-10-27 DIAGNOSIS — L570 Actinic keratosis: Secondary | ICD-10-CM

## 2021-10-27 DIAGNOSIS — Z79899 Other long term (current) drug therapy: Secondary | ICD-10-CM

## 2021-10-27 MED ORDER — TACROLIMUS 0.1 % EX OINT: TOPICAL_OINTMENT | Freq: Two times a day (BID) | CUTANEOUS | 1 refills | Status: DC

## 2021-10-27 NOTE — Patient Instructions (Addendum)
Cryotherapy Aftercare  Wash gently with soap and water everyday.   Apply Vaseline and Band-Aid daily until healed.     Due to recent changes in healthcare laws, you may see results of your pathology and/or laboratory studies on MyChart before the doctors have had a chance to review them. We understand that in some cases there may be results that are confusing or concerning to you. Please understand that not all results are received at the same time and often the doctors may need to interpret multiple results in order to provide you with the best plan of care or course of treatment. Therefore, we ask that you please give us 2 business days to thoroughly review all your results before contacting the office for clarification. Should we see a critical lab result, you will be contacted sooner.   If You Need Anything After Your Visit  If you have any questions or concerns for your doctor, please call our main line at [phone] and press option 4 to reach your doctor's medical assistant. If no one answers, please leave a voicemail as directed and we will return your call as soon as possible. Messages left after 4 pm will be answered the following business day.   You may also send us a message via MyChart. We typically respond to MyChart messages within 1-2 business days.  For prescription refills, please ask your pharmacy to contact our office. Our fax number is [phone].  If you have an urgent issue when the clinic is closed that cannot wait until the next business day, you can page your doctor at the number below.    Please note that while we do our best to be available for urgent issues outside of office hours, we are not available 24/7.   If you have an urgent issue and are unable to reach us, you may choose to seek medical care at your doctor's office, retail clinic, urgent care center, or emergency room.  If you have a medical emergency, please immediately call 911 or go to the  emergency department.  Pager Numbers  - Dr. Kowalski: [phone]  - Dr. Moye: [phone]  - Dr. Stewart: [phone]  In the event of inclement weather, please call our main line at [phone] for an update on the status of any delays or closures.  Dermatology Medication Tips: Please keep the boxes that topical medications come in in order to help keep track of the instructions about where and how to use these. Pharmacies typically print the medication instructions only on the boxes and not directly on the medication tubes.   If your medication is too expensive, please contact our office at [phone] option 4 or send us a message through MyChart.   We are unable to tell what your co-pay for medications will be in advance as this is different depending on your insurance coverage. However, we may be able to find a substitute medication at lower cost or fill out paperwork to get insurance to cover a needed medication.   If a prior authorization is required to get your medication covered by your insurance company, please allow us 1-2 business days to complete this process.  Drug prices often vary depending on where the prescription is filled and some pharmacies may offer cheaper prices.  The website www.goodrx.com contains coupons for medications through different pharmacies. The prices here do not account for what the cost may be with help from insurance (it may be cheaper with your insurance), but the website can   give you the price if you did not use any insurance.  - You can print the associated coupon and take it with your prescription to the pharmacy.  - You may also stop by our office during regular business hours and pick up a GoodRx coupon card.  - If you need your prescription sent electronically to a different pharmacy, notify our office through [facility] MyChart or by phone at [phone] option 4.     Si Usted Necesita Algo Despus de Su Visita  Tambin puede  enviarnos un mensaje a travs de MyChart. Por lo general respondemos a los mensajes de MyChart en el transcurso de 1 a 2 das hbiles.  Para renovar recetas, por favor pida a su farmacia que se ponga en contacto con nuestra oficina. Nuestro nmero de fax es el [phone].  Si tiene un asunto urgente cuando la clnica est cerrada y que no puede esperar hasta el siguiente da hbil, puede llamar/localizar a su doctor(a) al nmero que aparece a continuacin.   Por favor, tenga en cuenta que aunque hacemos todo lo posible para estar disponibles para asuntos urgentes fuera del horario de oficina, no estamos disponibles las 24 horas del da, los 7 das de la semana.   Si tiene un problema urgente y no puede comunicarse con nosotros, puede optar por buscar atencin mdica  en el consultorio de su doctor(a), en una clnica privada, en un centro de atencin urgente o en una sala de emergencias.  Si tiene una emergencia mdica, por favor llame inmediatamente al 911 o vaya a la sala de emergencias.  Nmeros de bper  - Dr. Kowalski: [phone]  - Dra. Moye: [phone]  - Dra. Stewart: [phone]  En caso de inclemencias del tiempo, por favor llame a nuestra lnea principal al [phone] para una actualizacin sobre el estado de cualquier retraso o cierre.  Consejos para la medicacin en dermatologa: Por favor, guarde las cajas en las que vienen los medicamentos de uso tpico para ayudarle a seguir las instrucciones sobre dnde y cmo usarlos. Las farmacias generalmente imprimen las instrucciones del medicamento slo en las cajas y no directamente en los tubos del medicamento.   Si su medicamento es muy caro, por favor, pngase en contacto con nuestra oficina llamando al [phone] y presione la opcin 4 o envenos un mensaje a travs de MyChart.   No podemos decirle cul ser su copago por los medicamentos por adelantado ya que esto es diferente dependiendo de la cobertura de su seguro.  Sin embargo, es posible que podamos encontrar un medicamento sustituto a menor costo o llenar un formulario para que el seguro cubra el medicamento que se considera necesario.   Si se requiere una autorizacin previa para que su compaa de seguros cubra su medicamento, por favor permtanos de 1 a 2 das hbiles para completar este proceso.  Los precios de los medicamentos varan con frecuencia dependiendo del lugar de dnde se surte la receta y alguna farmacias pueden ofrecer precios ms baratos.  El sitio web www.goodrx.com tiene cupones para medicamentos de diferentes farmacias. Los precios aqu no tienen en cuenta lo que podra costar con la ayuda del seguro (puede ser ms barato con su seguro), pero el sitio web puede darle el precio si no utiliz ningn seguro.  - Puede imprimir el cupn correspondiente y llevarlo con su receta a la farmacia.  - Tambin puede pasar por nuestra oficina durante el horario de atencin regular y recoger una tarjeta de cupones de GoodRx.  -   Si necesita que su receta se enve electrnicamente a una farmacia diferente, informe a nuestra oficina a travs de MyChart de [place] o por telfono llamando al [phone] y presione la opcin 4.  

## 2021-10-27 NOTE — Progress Notes (Signed)
Follow-Up Visit   Subjective  Scott Cochran is a 71 y.o. male who presents for the following: Psoriasis (3 months f/u on Psoriasis on the scalp treating with Calcipotriene solution with a fair response ) and Actinic Keratosis (3 months f/u on precancers on the face,scalp,ears and hands ). The patient has spots, moles and lesions to be evaluated, some may be new or changing and the patient has concerns that these could be cancer.  The following portions of the chart were reviewed this encounter and updated as appropriate:   Tobacco  Allergies  Meds  Problems  Med Hx  Surg Hx  Fam Hx     Review of Systems:  No other skin or systemic complaints except as noted in HPI or Assessment and Plan.  Objective  Well appearing patient in no apparent distress; mood and affect are within normal limits.  A focused examination was performed including face,scalp,ears,arms,neck. Relevant physical exam findings are noted in the Assessment and Plan.  Scalp Well-demarcated erythematous papules/plaques with silvery scale, guttate pink scaly papules.   face, ears, scalp,hands  x 20 (20) Erythematous thin papules/macules with gritty scale.   Left Hand - Anterior Dry pink patches    Assessment & Plan  Psoriasis Scalp Chronic and persistent condition with duration or expected duration over one year. Condition is symptomatic / bothersome to patient. Not to goal.   Psoriasis is a chronic non-curable, but treatable genetic/hereditary disease that may have other systemic features affecting other organ systems such as joints (Psoriatic Arthritis). It is associated with an increased risk of inflammatory bowel disease, heart disease, non-alcoholic fatty liver disease, and depression.     Related Medications Calcipotriene 0.005 % solution Apply 1 application. topically as directed. Apply to any scaly areas of scalp, face, ears daily  AK (actinic keratosis) (20) face, ears, scalp,hands  x 20 Actinic  keratoses are precancerous spots that appear secondary to cumulative UV radiation exposure/sun exposure over time. They are chronic with expected duration over 1 year. A portion of actinic keratoses will progress to squamous cell carcinoma of the skin. It is not possible to reliably predict which spots will progress to skin cancer and so treatment is recommended to prevent development of skin cancer.  Recommend daily broad spectrum sunscreen SPF 30+ to sun-exposed areas, reapply every 2 hours as needed.  Recommend staying in the shade or wearing long sleeves, sun glasses (UVA+UVB protection) and wide brim hats (4-inch brim around the entire circumference of the hat). Call for new or changing lesions.   Destruction of lesion - face, ears, scalp,hands  x 20 Complexity: simple   Destruction method: cryotherapy   Informed consent: discussed and consent obtained   Timeout:  patient name, date of birth, surgical site, and procedure verified Lesion destroyed using liquid nitrogen: Yes   Region frozen until ice ball extended beyond lesion: Yes   Outcome: patient tolerated procedure well with no complications   Post-procedure details: wound care instructions given    Hand dermatitis Left Hand - Anterior Hand dermatitis vs Psoriasis  Chronic and persistent condition with duration or expected duration over one year. Condition is symptomatic / bothersome to patient. Not to goal.  Start Protopic cream apply to hands daily   Related Medications tacrolimus (PROTOPIC) 0.1 % ointment Apply topically 2 (two) times daily.  Actinic Damage - chronic, secondary to cumulative UV radiation exposure/sun exposure over time - diffuse scaly erythematous macules with underlying dyspigmentation - Recommend daily broad spectrum sunscreen SPF 30+ to  sun-exposed areas, reapply every 2 hours as needed.  - Recommend staying in the shade or wearing long sleeves, sun glasses (UVA+UVB protection) and wide brim hats (4-inch  brim around the entire circumference of the hat). - Call for new or changing lesions.   Return in about 6 months (around 04/27/2022) for Aks, Psoriasis . IMarye Round, CMA, am acting as scribe for [name], MD .  Documentation: I have reviewed the above documentation for accuracy and completeness, and I agree with the above.  [name], MD

## 2021-10-28 ENCOUNTER — Ambulatory Visit: Payer: Medicare HMO | Admitting: Dermatology

## 2021-10-31 ENCOUNTER — Encounter: Payer: Self-pay | Admitting: Dermatology

## 2021-11-09 ENCOUNTER — Ambulatory Visit
Admission: RE | Admit: 2021-11-09 | Discharge: 2021-11-09 | Disposition: A | Discharge status: Home / Self Care | Payer: Medicare HMO | Source: Ambulatory Visit | Attending: Family Medicine | Admitting: Family Medicine

## 2021-11-09 DIAGNOSIS — M5416 Radiculopathy, lumbar region: Secondary | ICD-10-CM

## 2021-12-20 ENCOUNTER — Other Ambulatory Visit: Payer: Medicare HMO

## 2022-04-05 IMAGING — CT CT HEAD CODE STROKE
3 series · 15 of 46 positions shown, 18 images · non-contrast
Comparison: None.

CLINICAL DATA: Code stroke.  Right facial droop

EXAM:
CT HEAD WITHOUT CONTRAST
TECHNIQUE: Contiguous axial images were obtained from the base of the skull
through the vertex without intravenous contrast.

[Series 2: head wo · axial · 0.47mm/px · z∈[-152,-32]mm · 9 of 29 slices shown, 12 images]
[im 3/29  brain]
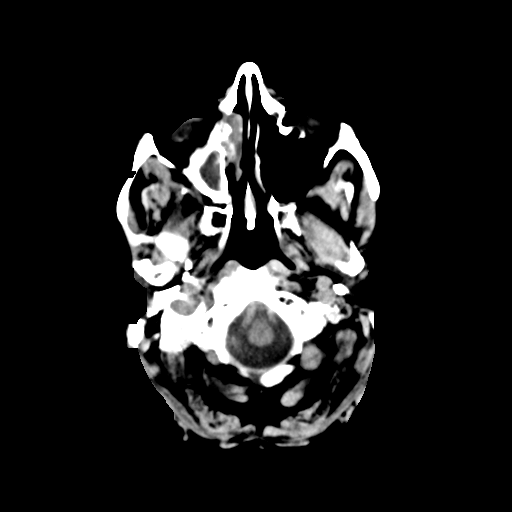
[im 3/29  bone]
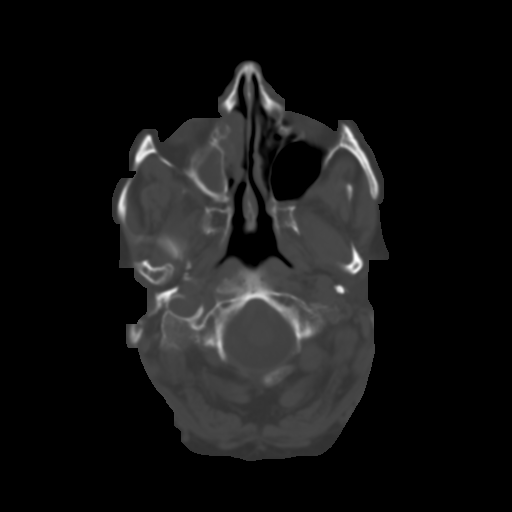
[im 6/29  brain]
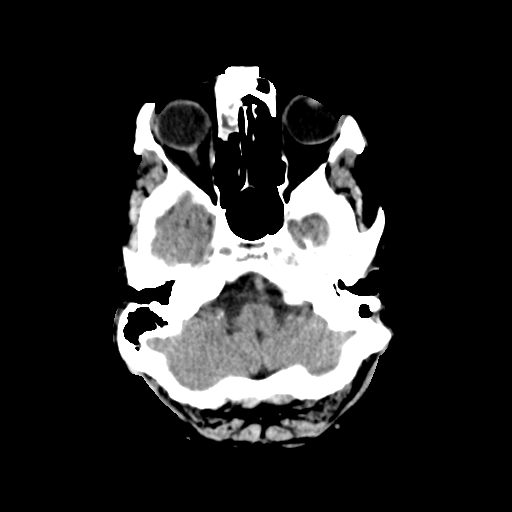
[im 9/29  brain]
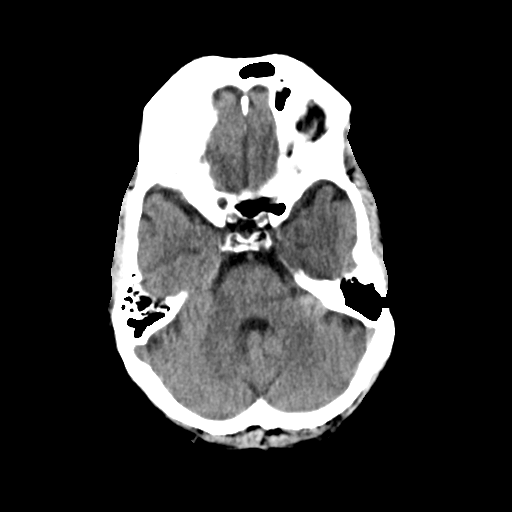
[im 12/29  brain]
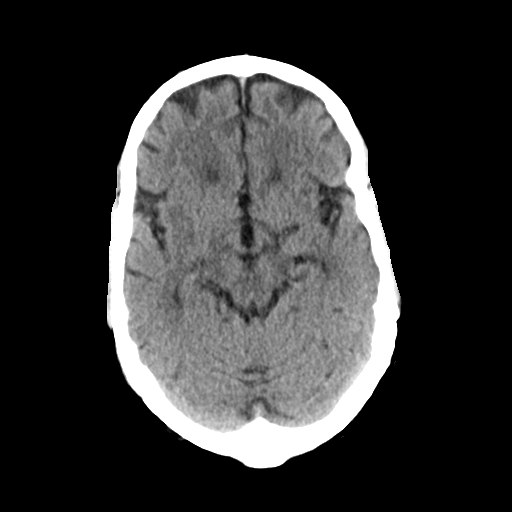
[im 15/29  brain]
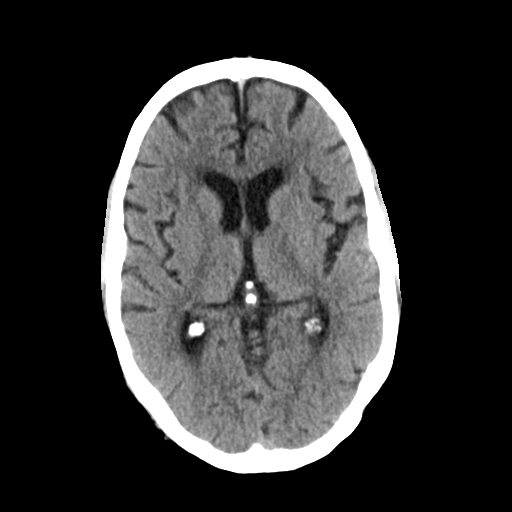
[im 15/29  bone]
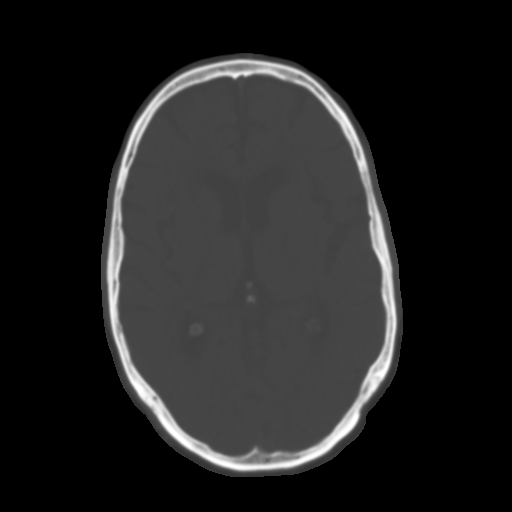
[im 18/29  brain]
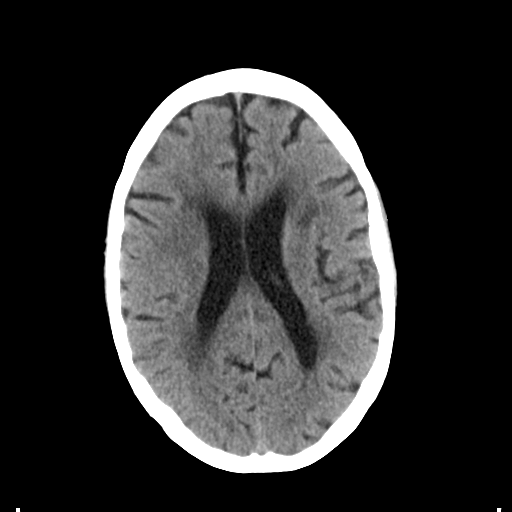
[im 21/29  brain]
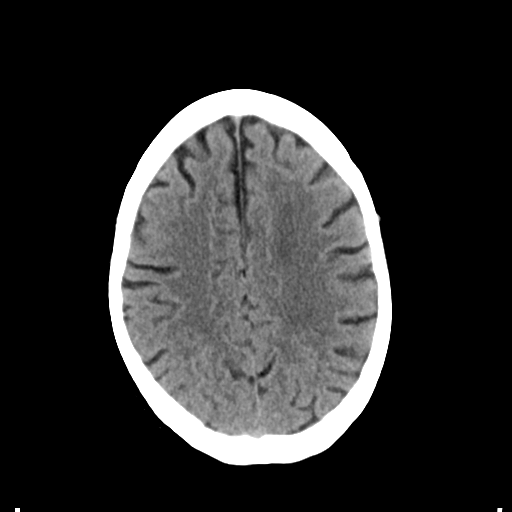
[im 24/29  brain]
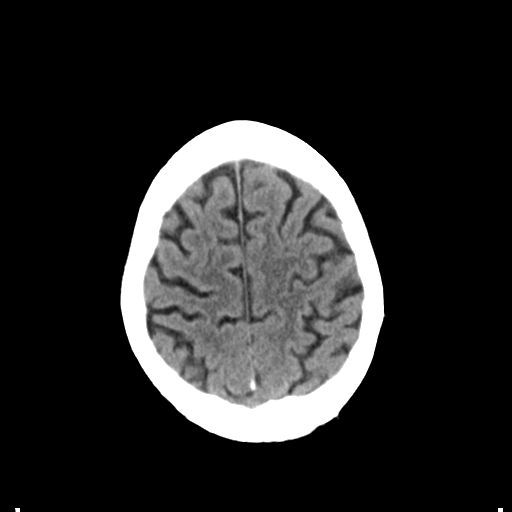
[im 27/29  brain]
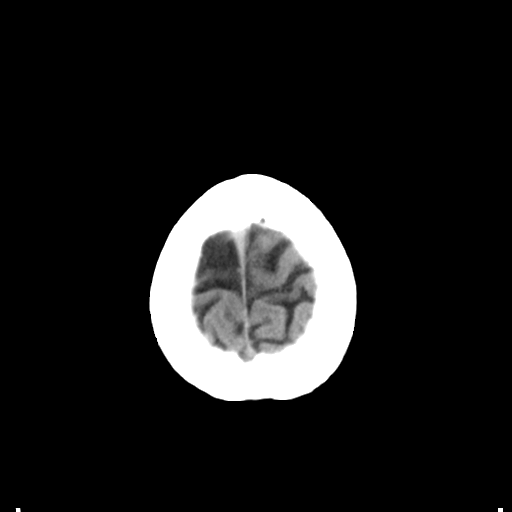
[im 27/29  bone]
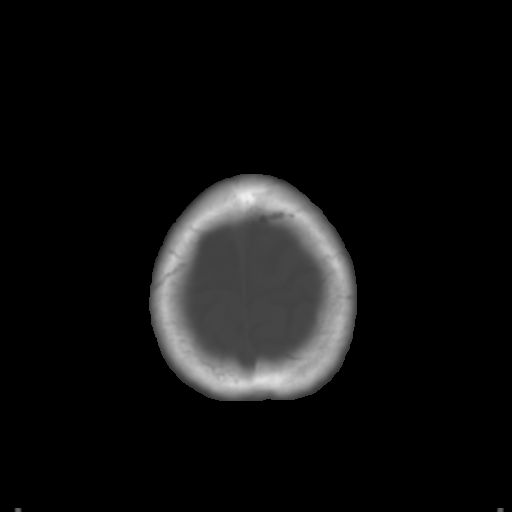

[Series 4: coronal soft tissue · coronal · 0.32mm/px · 3 of 67 slices shown]
[im 23/67  brain]
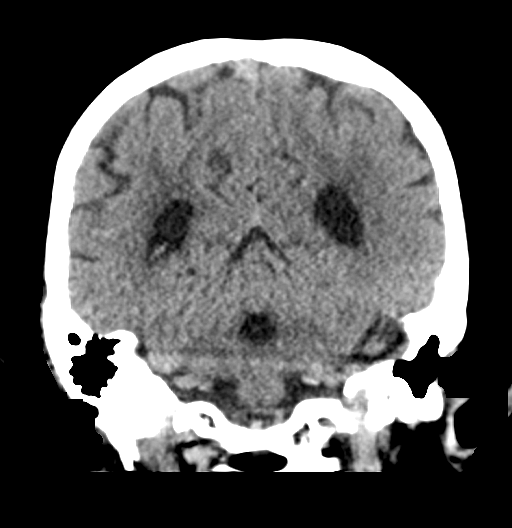
[im 30/67  brain]
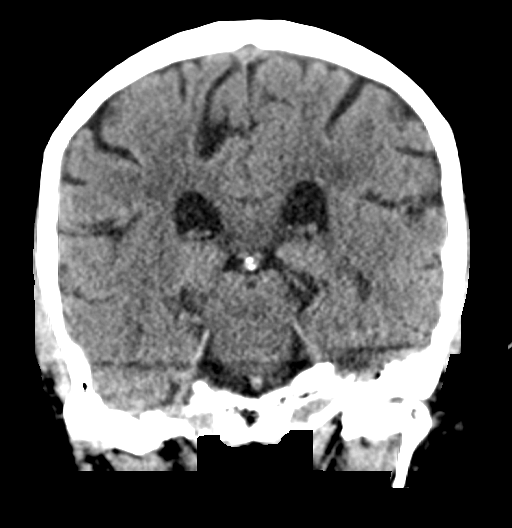
[im 37/67  brain]
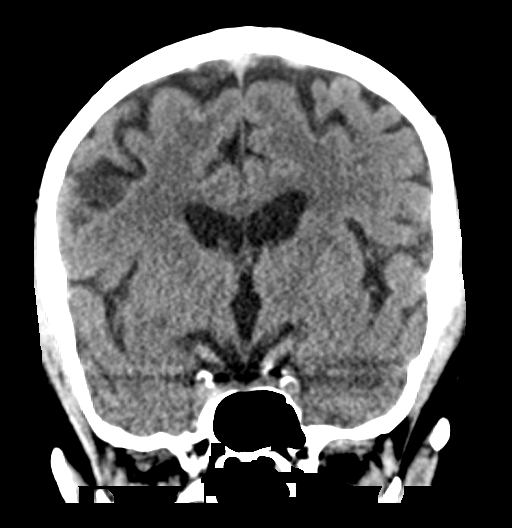

[Series 5: sagittal soft tissue · sagittal · 0.33mm/px · 3 of 54 slices shown]
[im 18/54  brain]
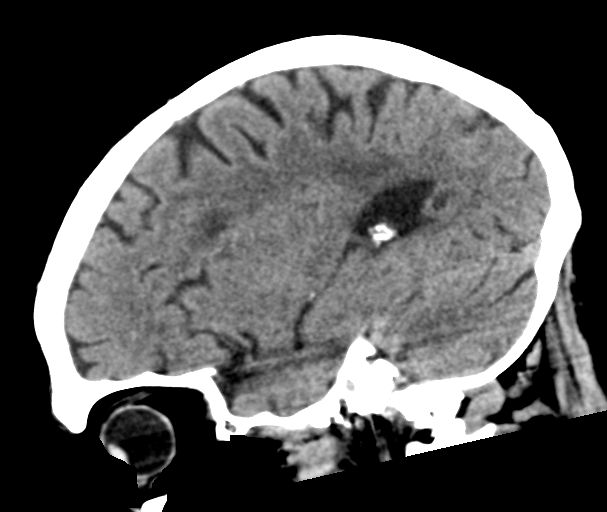
[im 27/54  brain]
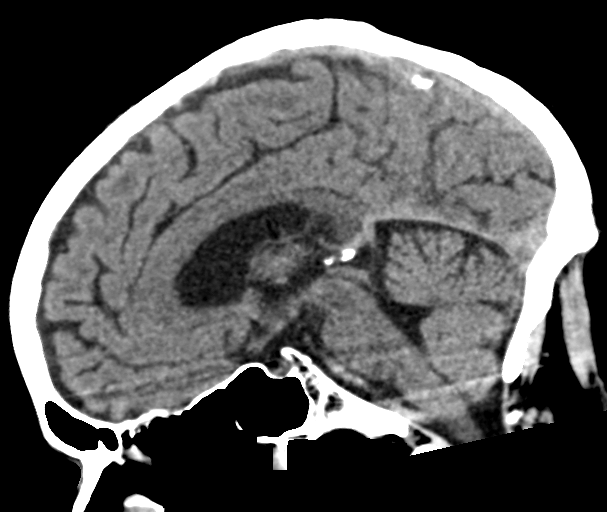
[im 36/54  brain]
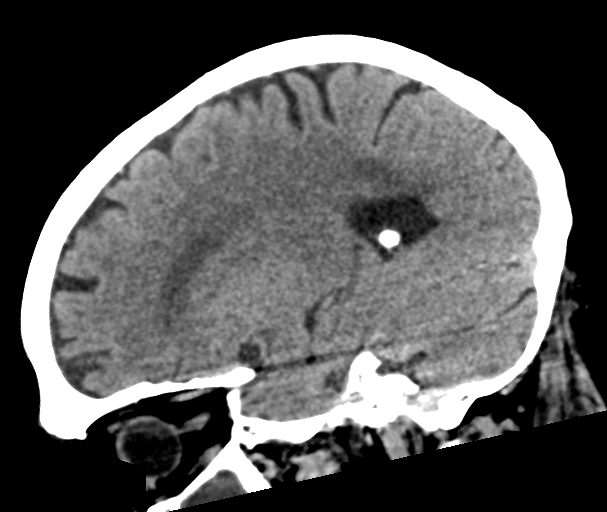

[15 of 46 positions shown; findings below may reference images not displayed]

FINDINGS: Brain: There is no acute intracranial hemorrhage, mass effect, or
edema. Gray-white differentiation is preserved. Patchy in confluent
hypoattenuation in the supratentorial white matter is nonspecific
but may reflect moderate chronic microvascular ischemic changes.
Prominence of the ventricles and sulci reflects minor generalized
parenchymal volume loss. There is no extra-axial fluid collection.

Vascular: No hyperdense vessel. There is mild intracranial
atherosclerotic calcification at the skull base.

Skull: Unremarkable

Sinuses/Orbits: Opacification of the right frontal, anterior
ethmoid, and visualized right maxillary sinuses. Soft tissue extends
into the nasal cavity.

Other: Mastoid air cells are clear. There is evidence of prior left
partial mastoidectomy.

ASPECTS (Alberta Stroke Program Early CT Score)

- Ganglionic level infarction (caudate, lentiform nuclei, internal
capsule, insula, M1-M3 cortex): 7

- Supraganglionic infarction (M4-M6 cortex): 3

Total score (0-10 with 10 being normal): 10
IMPRESSION: No acute intracranial hemorrhage or evidence of acute infarction.
ASPECT score is 10

Moderate chronic microvascular ischemic changes. Superimposed recent
small vessel infarct is difficult to exclude without comparison
studies.

Right maxillary, ethmoid, and frontal sinus opacification with soft
tissue extending into the nasal cavity. An obstructing lesion such
as a polyp is not excluded. Suggest nonemergent direct visual
inspection.

Initial results were called by telephone at the time of
, who verbally acknowledged these results.

## 2022-04-27 ENCOUNTER — Ambulatory Visit: Payer: Medicare HMO | Admitting: Dermatology

## 2022-04-27 VITALS — BP 119/73 | HR 58

## 2022-04-27 DIAGNOSIS — Z79899 Other long term (current) drug therapy: Secondary | ICD-10-CM

## 2022-04-27 DIAGNOSIS — Z7189 Other specified counseling: Secondary | ICD-10-CM

## 2022-04-27 DIAGNOSIS — L409 Psoriasis, unspecified: Secondary | ICD-10-CM

## 2022-04-27 DIAGNOSIS — L570 Actinic keratosis: Secondary | ICD-10-CM | POA: Diagnosis not present

## 2022-04-27 DIAGNOSIS — L309 Dermatitis, unspecified: Secondary | ICD-10-CM | POA: Diagnosis not present

## 2022-04-27 DIAGNOSIS — L578 Other skin changes due to chronic exposure to nonionizing radiation: Secondary | ICD-10-CM

## 2022-04-27 MED ORDER — TACROLIMUS 0.1 % EX OINT: TOPICAL_OINTMENT | Freq: Two times a day (BID) | CUTANEOUS | 4 refills | Status: DC

## 2022-04-27 NOTE — Patient Instructions (Addendum)
Actinic keratoses are precancerous spots that appear secondary to cumulative UV radiation exposure/sun exposure over time. They are chronic with expected duration over 1 year. A portion of actinic keratoses will progress to squamous cell carcinoma of the skin. It is not possible to reliably predict which spots will progress to skin cancer and so treatment is recommended to prevent development of skin cancer.  Recommend daily broad spectrum sunscreen SPF 30+ to sun-exposed areas, reapply every 2 hours as needed.  Recommend staying in the shade or wearing long sleeves, sun glasses (UVA+UVB protection) and wide brim hats (4-inch brim around the entire circumference of the hat). Call for new or changing lesions.    Cryotherapy Aftercare  Wash gently with soap and water everyday.   Apply Vaseline and Band-Aid daily until healed.   Photodynamic Therapy/Red Light Therapy  Actinic keratoses are the dry, red scaly spots on the skin caused by sun damage. A portion of these spots can turn into skin cancer with time, and treating them can help prevent development of skin cancer.   Treatment of these spots requires removal of the defective skin cells. There are various ways to remove actinic keratoses, including freezing with liquid nitrogen, treatment with creams, or treatment with a blue light procedure in the office.   Photodynamic Therapy (PDT), also known as "Red light therapy" is an in office procedure used to treat actinic keratoses. It works by targeting precancerous cells. After treatment, these cells peel off and are replaced by healthy ones.   For your phototherapy appointment, you will have two appointments on the day of your treatment. The first appointment will be to apply a cream to the treatment area. You will leave this cream on for 1-2 hours depending on the area being treated. The second appointment will be to shine a blue light on the area for 16 minutes to kill off the precancer cells.  It is common to experience a burning sensation during the treatment.  After your treatment, it will be important to keep the treated areas of skin out of the sun completely for 48-72 hours (2-3 days) to prevent having a reaction.   Common side effects include: - Burning or stinging, which may be severe and can last up to 24-72 hours after your treatment - Scaling and crusting which may last up to 2 weeks - Redness, swelling and/or peeling which can last up to 4 weeks  To Care for Your Skin After PDT/ Red Light Therapy: - Wash with soap, water and shampoo as normal. - If needed, you can use cold compresses (e.g. ice packs) for comfort - If okay with your primary care doctor, you may use analgesics such as acetaminophen (tylenol) every 4-6 hours, not to exceed recommended dose - You may apply Cerave Healing Ointment, Vaseline or Aquaphor as needed - If you have a lot of swelling you may take a Benadryl to help with this (this may cause drowsiness), not to exceed recommended dose. This may increase the risk of falls in people over 65 and may slow reaction time while driving, so it is not recommended to take before driving or operating machinery. - Sun Precautions - Wear a wide brim hat for the next week if outside  - Wear a sunblock with zinc or titanium dioxide at least SPF 50 daily  If you have any questions or concerns, please call the office and ask to speak with a nurse.   --------------------------------------------------------------------------------------------------------------   For Psoriasis on Scalp  Can continue  Calcipotriene Solution to affected areas at scalp and ears daily   For psoriasis hand dermatitis  Can start Protopic cream - apply to hands twice daily as needed.       Recommend Cerave am morning with sunscreen Recommend Cerave moisturizer cream  Gentle Skin Care Guide  1. Bathe no more than once a day.  2. Avoid bathing in hot water  3. Use a mild soap  like Dove, Vanicream, Cetaphil, CeraVe. Can use Lever 2000 or Cetaphil antibacterial soap  4. Use soap only where you need it. On most days, use it under your arms, between your legs, and on your feet. Let the water rinse other areas unless visibly dirty.  5. When you get out of the bath/shower, use a towel to gently blot your skin dry, don't rub it.  6. While your skin is still a little damp, apply a moisturizing cream such as Vanicream, CeraVe, Cetaphil, Eucerin, Sarna lotion or plain Vaseline Jelly. For hands apply Neutrogena [place] Hand Cream or Excipial Hand Cream.  7. Reapply moisturizer any time you start to itch or feel dry.  8. Sometimes using free and clear laundry detergents can be helpful. Fabric softener sheets should be avoided. Downy Free & Gentle liquid, or any liquid fabric softener that is free of dyes and perfumes, it acceptable to use  9. If your doctor has given you prescription creams you may apply moisturizers over them         Due to recent changes in healthcare laws, you may see results of your pathology and/or laboratory studies on MyChart before the doctors have had a chance to review them. We understand that in some cases there may be results that are confusing or concerning to you. Please understand that not all results are received at the same time and often the doctors may need to interpret multiple results in order to provide you with the best plan of care or course of treatment. Therefore, we ask that you please give [place] 2 business days to thoroughly review all your results before contacting the office for clarification. Should we see a critical lab result, you will be contacted sooner.   If You Need Anything After Your Visit  If you have any questions or concerns for your doctor, please call our main line at [phone] and press option 4 to reach your doctor's medical assistant. If no one answers, please leave a voicemail as directed and we will return  your call as soon as possible. Messages left after 4 pm will be answered the following business day.   You may also send [place] a message via [place]. We typically respond to MyChart messages within 1-2 business days.  For prescription refills, please ask your pharmacy to contact our office. Our fax number is [phone].  If you have an urgent issue when the clinic is closed that cannot wait until the next business day, you can page your doctor at the number below.    Please note that while we do our best to be available for urgent issues outside of office hours, we are not available 24/7.   If you have an urgent issue and are unable to reach [place], you may choose to seek medical care at your doctor's office, retail clinic, urgent care center, or emergency room.  If you have a medical emergency, please immediately call 911 or go to the emergency department.  Pager Numbers  - Dr. Nehemiah Massed: [phone]  - Dr. Laurence FerrariFF:[phone]  - Dr.  Nicole [phone]  In the event of inclement weather, please call our main line at [phone] for an update on the status of any delays or closures.  Dermatology Medication Tips: Please keep the boxes that topical medications come in in order to help keep track of the instructions about where and how to use these. Pharmacies typically print the medication instructions only on the boxes and not directly on the medication tubes.   If your medication is too expensive, please contact our office at [phone] option 4 or send [place] a message through [place].   We are unable to tell what your co-pay for medications will be in advance as this is different depending on your insurance coverage. However, we may be able to find a substitute medication at lower cost or fill out paperwork to get insurance to cover a needed medication.   If a prior authorization is required to get your medication covered by your insurance company, please allow [place] 1-2 business days to  complete this process.  Drug prices often vary depending on where the prescription is filled and some pharmacies may offer cheaper prices.  The website www.goodrx.com contains coupons for medications through different pharmacies. The prices here do not account for what the cost may be with help from insurance (it may be cheaper with your insurance), but the website can give you the price if you did not use any insurance.  - You can print the associated coupon and take it with your prescription to the pharmacy.  - You may also stop by our office during regular business hours and pick up a GoodRx coupon card.  - If you need your prescription sent electronically to a different pharmacy, notify our office through [facility] or by phone at [phone] option 4.     Si Usted Necesita Algo Despus de Su Visita  Tambin puede enviarnos un mensaje a travs de Pharmacist, community. Por lo general respondemos a los mensajes de MyChart en el transcurso de 1 a 2 das hbiles.  Para renovar recetas, por favor pida a su farmacia que se ponga en contacto con nuestra oficina. Harland Dingwall de fax es [place] [phone].  Si tiene un asunto urgente cuando la clnica est cerrada y que no puede esperar hasta el siguiente da hbil, puede llamar/localizar a su doctor(a) al nmero que aparece a continuacin.   Por favor, tenga en cuenta que aunque hacemos todo lo posible para estar disponibles para asuntos urgentes fuera del horario de [place], no estamos disponibles las 24 horas del da, los 7 das de la [place].   Si tiene un problema urgente y no puede comunicarse con nosotros, puede optar por buscar atencin mdica  en el consultorio de su doctor(a), en una clnica privada, en un centro de atencin urgente o en una sala de emergencias.  Si tiene Engineering geologist, por favor llame inmediatamente al 911 o vaya a la sala de emergencias.  Nmeros de bper  - Dr. Nehemiah Massed: [phone]  - Dra. Moye:  [phone]  - Dra. Nicole Kindred: [phone]  En caso de inclemencias del [place], por favor llame a Johnsie Kindred principal al [phone] para una actualizacin sobre el [place] de cualquier retraso o cierre.  Consejos para la medicacin en dermatologa: Por favor, guarde las cajas en las que vienen los medicamentos de uso tpico para ayudarle a seguir las instrucciones sobre dnde y cmo usarlos. Las farmacias generalmente imprimen las instrucciones del medicamento slo en las cajas y no directamente en los tubos del  medicamento.   Si su medicamento es muy caro, por favor, pngase en contacto con Zigmund Daniel llamando al [phone] y presione la opcin 4 o envenos un mensaje a travs de Pharmacist, community.   No podemos decirle cul ser su copago por los medicamentos por adelantado ya que esto es diferente dependiendo de la cobertura de su seguro. Sin embargo, es posible que podamos encontrar un medicamento sustituto a Electrical engineer un formulario para que el seguro cubra el medicamento que se considera necesario.   Si se requiere una autorizacin previa para que su compaa de seguros [place] su medicamento, por favor permtanos de 1 a 2 das hbiles para completar este proceso.  Los precios de los medicamentos varan con frecuencia dependiendo del Environmental consultant de dnde se surte la receta y alguna farmacias pueden ofrecer precios ms baratos.  El sitio web www.goodrx.com tiene cupones para medicamentos de Airline pilot. Los precios aqu no tienen en cuenta lo que podra costar con la ayuda del seguro (puede ser ms barato con su seguro), pero el sitio web puede darle el precio si no utiliz Research scientist (physical sciences).  - Puede imprimir el cupn correspondiente y llevarlo con su receta a la farmacia.  - Tambin puede pasar por nuestra oficina durante el horario de atencin regular y Charity fundraiser una tarjeta de cupones de GoodRx.  - Si necesita que su receta se enve electrnicamente a una farmacia diferente,  informe a nuestra oficina a travs de MyChart de [place] o por telfono llamando al [phone] y presione la opcin 4.

## 2022-04-27 NOTE — Progress Notes (Signed)
Follow-Up Visit   Subjective  Scott Cochran is a 72 y.o. male who presents for the following: Psoriasis (6 month psoriasis - scalp using calciipotriene 0.005 % solutions as needed), Actinic Keratosis (Hx of aks, reports some red spots at right cheek and nose), and hand dermatitis (Hx at both hands, not currently using treatment, has used protopic cream in past). The patient has spots, moles and lesions to be evaluated, some may be new or changing and the patient has concerns that these could be cancer.  The following portions of the chart were reviewed this encounter and updated as appropriate:  Tobacco  Allergies  Meds  Problems  Med Hx  Surg Hx  Fam Hx     Review of Systems: No other skin or systemic complaints except as noted in HPI or Assessment and Plan.  Objective  Well appearing patient in no apparent distress; mood and affect are within normal limits.  A focused examination was performed including face, hands, scalp. Relevant physical exam findings are noted in the Assessment and Plan.  face x 6 (6) Erythematous thin papules/macules with gritty scale.    Assessment & Plan  Psoriasis scalp Chronic and persistent condition with duration or expected duration over one year. Condition is symptomatic / bothersome to patient. Not to goal.  Psoriasis is a chronic non-curable, but treatable genetic/hereditary disease that may have other systemic features affecting other organ systems such as joints (Psoriatic Arthritis). It is associated with an increased risk of inflammatory bowel disease, heart disease, non-alcoholic fatty liver disease, and depression.     Continue Calcipotriene 0.005 % solution apply topically to any scaly areas of scalp face, ears daily    Related Medications Calcipotriene 0.005 % solution Apply 1 application. topically as directed. Apply to any scaly areas of scalp, face, ears daily  Hand dermatitis Hand dermatitis/ atopic dermatitis with Psoriasis  overlap b/l hands Hand Dermatitis is a chronic type of eczema that can come and go on the hands and fingers.  While there is no cure, the rash and symptoms can be managed with topical prescription medications, and for more severe cases, with systemic medications.  Recommend mild soap and routine use of moisturizing cream after handwashing.  Minimize soap/water exposure when possible.   Chronic and persistent condition with duration or expected duration over one year. Condition is symptomatic / bothersome to patient. Not to goal.   Start Protopic cream apply to hands twice daily prn May consider Georga Hacking / Anner Crete / [facility]tama in future   tacrolimus (PROTOPIC) 0.1 % ointment - b/l hands Apply topically 2 (two) times daily.  Actinic keratosis x6 Face x 6 Discussed 5 f/u calcipotriene cream or Red Light treatment to face with debridement.  Will schedule in 6 weeks Patient prefers red light therapy treatment with debridement to face.  Actinic keratoses are precancerous spots that appear secondary to cumulative UV radiation exposure/sun exposure over time. They are chronic with expected duration over 1 year. A portion of actinic keratoses will progress to squamous cell carcinoma of the skin. It is not possible to reliably predict which spots will progress to skin cancer and so treatment is recommended to prevent development of skin cancer.  Recommend daily broad spectrum sunscreen SPF 30+ to sun-exposed areas, reapply every 2 hours as needed.  Recommend staying in the shade or wearing long sleeves, sun glasses (UVA+UVB protection) and wide brim hats (4-inch brim around the entire circumference of the hat). Call for new or changing lesions.  Destruction  of lesion - face Complexity: simple   Destruction method: cryotherapy   Informed consent: discussed and consent obtained   Timeout:  patient name, date of birth, surgical site, and procedure verified Lesion destroyed using liquid nitrogen: Yes   Region  frozen until ice ball extended beyond lesion: Yes   Outcome: patient tolerated procedure well with no complications   Post-procedure details: wound care instructions given    Related Medications Calcipotriene 0.005 % solution Apply 1 application. topically as directed. Apply to any scaly areas of scalp, face, ears daily  Actinic Damage with PreCancerous Actinic Keratoses Counseling for Topical Chemotherapy Management: Patient exhibits: - Severe, confluent actinic changes with pre-cancerous actinic keratoses that is secondary to cumulative UV radiation exposure over time - Condition that is severe; chronic, not at goal. - diffuse scaly erythematous macules and papules with underlying dyspigmentation - Discussed Prescription "Field Treatment" topical Chemotherapy for Severe, Chronic Confluent Actinic Changes with Pre-Cancerous Actinic Keratoses Field treatment involves treatment of an entire area of skin that has confluent Actinic Changes (Sun/ Ultraviolet light damage) and PreCancerous Actinic Keratoses by method of PhotoDynamic Therapy (PDT) and/or prescription Topical Chemotherapy agents such as 5-fluorouracil, 5-fluorouracil/calcipotriene, and/or imiquimod.  The purpose is to decrease the number of clinically evident and subclinical PreCancerous lesions to prevent progression to development of skin cancer by chemically destroying early precancer changes that may or may not be visible.  It has been shown to reduce the risk of developing skin cancer in the treated area. As a result of treatment, redness, scaling, crusting, and open sores may occur during treatment course. One or more than one of these methods may be used and may have to be used several times to control, suppress and eliminate the PreCancerous changes. Discussed treatment course, expected reaction, and possible side effects. - Recommend daily broad spectrum sunscreen SPF 30+ to sun-exposed areas, reapply every 2 hours as needed.  -  Staying in the shade or wearing long sleeves, sun glasses (UVA+UVB protection) and wide brim hats (4-inch brim around the entire circumference of the hat) are also recommended. - Call for new or changing lesions.  - Will schedule red light photodynamic therapy to face with debridement in 6 weeks to face   Return for 4 - 6 month ak follow up.  IRuthell Rummage, CMA, am acting as scribe for [name], MD. Documentation: I have reviewed the above documentation for accuracy and completeness, and I agree with the above.  [name], MD

## 2022-04-29 ENCOUNTER — Encounter: Payer: Self-pay | Admitting: Dermatology

## 2022-05-02 ENCOUNTER — Telehealth: Payer: Self-pay

## 2022-05-02 DIAGNOSIS — L309 Dermatitis, unspecified: Secondary | ICD-10-CM

## 2022-05-02 NOTE — Telephone Encounter (Signed)
Patient states that with insurance Tacrolimus around $150, and that isn't a reasonable cost for him. What alternative would you like me to send in? Patient is aware that you are out of the office until next week.

## 2022-05-04 NOTE — Telephone Encounter (Signed)
We can wait for Dr. Nehemiah Massed to respond, but we could see if it would be $35 or less from [facility] if patient is interested.

## 2022-05-05 NOTE — Telephone Encounter (Signed)
Left voicemail for patient to return my call. 

## 2022-05-10 MED ORDER — TACROLIMUS 0.1 % EX OINT: TOPICAL_OINTMENT | Freq: Two times a day (BID) | CUTANEOUS | 4 refills | Status: DC

## 2022-05-10 NOTE — Telephone Encounter (Signed)
Discussed with patient and he is OK with sending prescription to [facility] patient to contact us if still expensive.

## 2022-05-10 NOTE — Addendum Note (Signed)
Addended by: Rudell Cobb A on: 05/10/2022 08:54 AM   Modules accepted: Orders

## 2022-06-09 ENCOUNTER — Ambulatory Visit: Payer: Medicare HMO

## 2022-09-15 ENCOUNTER — Other Ambulatory Visit: Payer: Self-pay | Admitting: Dermatology

## 2022-09-15 DIAGNOSIS — L570 Actinic keratosis: Secondary | ICD-10-CM

## 2022-09-15 DIAGNOSIS — L409 Psoriasis, unspecified: Secondary | ICD-10-CM

## 2022-11-10 ENCOUNTER — Encounter: Payer: Self-pay | Admitting: Dermatology

## 2022-11-10 ENCOUNTER — Ambulatory Visit: Payer: Medicare HMO | Admitting: Dermatology

## 2022-11-10 DIAGNOSIS — L578 Other skin changes due to chronic exposure to nonionizing radiation: Secondary | ICD-10-CM

## 2022-11-10 DIAGNOSIS — L820 Inflamed seborrheic keratosis: Secondary | ICD-10-CM | POA: Diagnosis not present

## 2022-11-10 DIAGNOSIS — Z7189 Other specified counseling: Secondary | ICD-10-CM

## 2022-11-10 DIAGNOSIS — W908XXA Exposure to other nonionizing radiation, initial encounter: Secondary | ICD-10-CM | POA: Diagnosis not present

## 2022-11-10 DIAGNOSIS — L570 Actinic keratosis: Secondary | ICD-10-CM

## 2022-11-10 DIAGNOSIS — L409 Psoriasis, unspecified: Secondary | ICD-10-CM | POA: Diagnosis not present

## 2022-11-10 DIAGNOSIS — Z79899 Other long term (current) drug therapy: Secondary | ICD-10-CM | POA: Diagnosis not present

## 2022-11-10 NOTE — Progress Notes (Signed)
Follow-Up Visit   Subjective  Scott Cochran is a 72 y.o. male who presents for the following: 6 month AK follow up on face. Tx with LN2 at last visit. Patient states he did not have light treatment after last visit.  The patient has spots, moles and lesions to be evaluated, some may be new or changing and the patient may have concern these could be cancer.  Psoriasis follow up. Scalp, ears, face. Has been using Calcipotriene solution as directed. Thinks is under better control.   The following portions of the chart were reviewed this encounter and updated as appropriate: medications, allergies, medical history  Review of Systems:  No other skin or systemic complaints except as noted in HPI or Assessment and Plan.  Objective  Well appearing patient in no apparent distress; mood and affect are within normal limits.  A focused examination was performed of the following areas: Scalp, face, arms, hands  Relevant exam findings are noted in the Assessment and Plan.  Left Elbow - Posterior x1 Erythematous keratotic or waxy stuck-on papule or plaque.  Scalp, hands, and nose x11 (11) Erythematous thin papules/macules with gritty scale.     Assessment & Plan   PSORIASIS Exam: Well-demarcated erythematous papules/plaques with silvery scale, guttate pink scaly papules at scalp and ears. 2% BSA.  Chronic and persistent condition with duration or expected duration over one year. Condition is improving with treatment but not currently at goal.  patient denies joint pain  Psoriasis is a chronic non-curable, but treatable genetic/hereditary disease that may have other systemic features affecting other organ systems such as joints (Psoriatic Arthritis). It is associated with an increased risk of inflammatory bowel disease, heart disease, non-alcoholic fatty liver disease, and depression.  Treatments include light and laser treatments; topical medications; and systemic medications including oral  and injectables.  Treatment Plan: Continue Calcipotriene 0.005 % solution apply topically to any scaly areas of scalp face, ears daily    Inflamed seborrheic keratosis Left Elbow - Posterior x1  Symptomatic, irritating, patient would like treated.  Destruction of lesion - Left Elbow - Posterior x1 Complexity: simple   Destruction method: cryotherapy   Informed consent: discussed and consent obtained   Timeout:  patient name, date of birth, surgical site, and procedure verified Lesion destroyed using liquid nitrogen: Yes   Region frozen until ice ball extended beyond lesion: Yes   Outcome: patient tolerated procedure well with no complications   Post-procedure details: wound care instructions given   Additional details:  Prior to procedure, discussed risks of blister formation, small wound, skin dyspigmentation, or rare scar following cryotherapy. Recommend Vaseline ointment to treated areas while healing.   AK (actinic keratosis) (11) Scalp, hands, and nose x11  Actinic keratoses are precancerous spots that appear secondary to cumulative UV radiation exposure/sun exposure over time. They are chronic with expected duration over 1 year. A portion of actinic keratoses will progress to squamous cell carcinoma of the skin. It is not possible to reliably predict which spots will progress to skin cancer and so treatment is recommended to prevent development of skin cancer.  Recommend daily broad spectrum sunscreen SPF 30+ to sun-exposed areas, reapply every 2 hours as needed.  Recommend staying in the shade or wearing long sleeves, sun glasses (UVA+UVB protection) and wide brim hats (4-inch brim around the entire circumference of the hat). Call for new or changing lesions.  Destruction of lesion - Scalp, hands, and nose x11 (11) Complexity: simple   Destruction method: cryotherapy  Informed consent: discussed and consent obtained   Timeout:  patient name, date of birth, surgical site,  and procedure verified Lesion destroyed using liquid nitrogen: Yes   Region frozen until ice ball extended beyond lesion: Yes   Outcome: patient tolerated procedure well with no complications   Post-procedure details: wound care instructions given   Additional details:  Prior to procedure, discussed risks of blister formation, small wound, skin dyspigmentation, or rare scar following cryotherapy. Recommend Vaseline ointment to treated areas while healing.   ACTINIC DAMAGE - chronic, secondary to cumulative UV radiation exposure/sun exposure over time - diffuse scaly erythematous macules with underlying dyspigmentation - Recommend daily broad spectrum sunscreen SPF 30+ to sun-exposed areas, reapply every 2 hours as needed.  - Recommend staying in the shade or wearing long sleeves, sun glasses (UVA+UVB protection) and wide brim hats (4-inch brim around the entire circumference of the hat). - Call for new or changing lesions.   Return for AK Follow Up, Psoriasis Follow Up in 6-8 months.  I, Lawson Radar, CMA, am acting as scribe for Armida Sans, MD.   Documentation: I have reviewed the above documentation for accuracy and completeness, and I agree with the above.  Armida Sans, MD

## 2022-11-10 NOTE — Patient Instructions (Signed)
Cryotherapy Aftercare  Wash gently with soap and water everyday.   Apply Vaseline Jelly daily until healed.    Recommend daily broad spectrum sunscreen SPF 30+ to sun-exposed areas, reapply every 2 hours as needed. Call for new or changing lesions.  Staying in the shade or wearing long sleeves, sun glasses (UVA+UVB protection) and wide brim hats (4-inch brim around the entire circumference of the hat) are also recommended for sun protection.    Due to recent changes in healthcare laws, you may see results of your pathology and/or laboratory studies on MyChart before the doctors have had a chance to review them. We understand that in some cases there may be results that are confusing or concerning to you. Please understand that not all results are received at the same time and often the doctors may need to interpret multiple results in order to provide you with the best plan of care or course of treatment. Therefore, we ask that you please give [place] 2 business days to thoroughly review all your results before contacting the office for clarification. Should we see a critical lab result, you will be contacted sooner.   If You Need Anything After Your Visit  If you have any questions or concerns for your doctor, please call our main line at [phone] and press option 4 to reach your doctor's medical assistant. If no one answers, please leave a voicemail as directed and we will return your call as soon as possible. Messages left after 4 pm will be answered the following business day.   You may also send [place] a message via MyChart. We typically respond to MyChart messages within 1-2 business days.  For prescription refills, please ask your pharmacy to contact our office. Our fax number is [phone].  If you have an urgent issue when the clinic is closed that cannot wait until the next business day, you can page your doctor at the number below.    Please note that while we do our best to be  available for urgent issues outside of office hours, we are not available 24/7.   If you have an urgent issue and are unable to reach [place], you may choose to seek medical care at your doctor's office, retail clinic, urgent care center, or emergency room.  If you have a medical emergency, please immediately call 911 or go to the emergency department.  Pager Numbers  - Dr. Gwen Pounds: [phone]  - Dr. Roseanne Reno: [phone]  - Dr. Katrinka Blazing: [phone]   In the event of inclement weather, please call our main line at [phone] for an update on the status of any delays or closures.  Dermatology Medication Tips: Please keep the boxes that topical medications come in in order to help keep track of the instructions about where and how to use these. Pharmacies typically print the medication instructions only on the boxes and not directly on the medication tubes.   If your medication is too expensive, please contact our office at [phone] option 4 or send [place] a message through MyChart.   We are unable to tell what your co-pay for medications will be in advance as this is different depending on your insurance coverage. However, we may be able to find a substitute medication at lower cost or fill out paperwork to get insurance to cover a needed medication.   If a prior authorization is required to get your medication covered by your insurance company, please allow [place] 1-2 business days to complete this process.  Drug prices often vary depending on where the prescription is filled and some pharmacies may offer cheaper prices.  The website www.goodrx.com contains coupons for medications through different pharmacies. The prices here do not account for what the cost may be with help from insurance (it may be cheaper with your insurance), but the website can give you the price if you did not use any insurance.  - You can print the associated coupon and take it with your prescription to the pharmacy.   - You may also stop by our office during regular business hours and pick up a GoodRx coupon card.  - If you need your prescription sent electronically to a different pharmacy, notify our office through [facility] or by phone at [phone] option 4.     Si Usted Necesita Algo Despus de Su Visita  Tambin puede enviarnos un mensaje a travs de Clinical cytogeneticist. Por lo general respondemos a los mensajes de MyChart en el transcurso de 1 a 2 das hbiles.  Para renovar recetas, por favor pida a su farmacia que se ponga en contacto con nuestra oficina. Annie Sable de fax es [place] [phone].  Si tiene un asunto urgente cuando la clnica est cerrada y que no puede esperar hasta el siguiente da hbil, puede llamar/localizar a su doctor(a) al nmero que aparece a continuacin.   Por favor, tenga en cuenta que aunque hacemos todo lo posible para estar disponibles para asuntos urgentes fuera del horario de [place], no estamos disponibles las 24 horas del da, los 7 [address]  [address] de la [place].   Si tiene un problema urgente y no puede comunicarse con nosotros, puede optar por buscar atencin mdica  en el consultorio de su doctor(a), en una clnica privada, en un centro de atencin urgente o en una sala de emergencias.  Si tiene Engineer, drilling, por favor llame inmediatamente al 911 o vaya a la sala de emergencias.  Nmeros de bper  - Dr. Gwen Pounds: [phone]  - Dra. Roseanne Reno: [phone]  - Dr. Katrinka Blazing: [phone]   En caso de inclemencias del tiempo, por favor llame a Lacy Duverney principal al [phone] para una actualizacin sobre el [place] de cualquier retraso o cierre.  Consejos para la medicacin en dermatologa: Por favor, guarde las cajas en las que vienen los medicamentos de uso tpico para ayudarle a seguir las instrucciones sobre dnde y cmo usarlos. Las farmacias generalmente imprimen las instrucciones del medicamento slo en las cajas y no directamente en los tubos del  [place].   Si su medicamento es muy caro, por favor, pngase en contacto con Rolm Gala llamando al [phone] y presione la opcin 4 o envenos un mensaje a travs de Clinical cytogeneticist.   No podemos decirle cul ser su copago por los medicamentos por adelantado ya que esto es diferente dependiendo de la cobertura de su seguro. Sin embargo, es posible que podamos encontrar un medicamento sustituto a Audiological scientist un formulario para que el seguro cubra el medicamento que se considera necesario.   Si se requiere una autorizacin previa para que su compaa de seguros [place] su medicamento, por favor permtanos de 1 a 2 das hbiles para completar [address]  Los precios de los medicamentos varan con frecuencia dependiendo del Environmental consultant de dnde se surte la receta y alguna farmacias pueden ofrecer precios ms baratos.  El sitio web www.goodrx.com tiene cupones para medicamentos de Health and safety inspector. Los precios aqu no tienen en cuenta lo que podra costar con la ayuda del seguro (puede ser  ms barato con su seguro), pero el sitio web puede darle el precio si no Visual merchandiser.  - Puede imprimir el cupn correspondiente y llevarlo con su receta a la farmacia.  - Tambin puede pasar por nuestra oficina durante el horario de atencin regular y Education officer, museum una tarjeta de cupones de GoodRx.  - Si necesita que su receta se enve electrnicamente a una farmacia diferente, informe a nuestra oficina a travs de MyChart de [place] o por telfono llamando al [phone] y presione la opcin 4.

## 2023-02-27 ENCOUNTER — Ambulatory Visit (INDEPENDENT_AMBULATORY_CARE_PROVIDER_SITE_OTHER): Payer: Medicare HMO | Admitting: Dermatology

## 2023-02-27 ENCOUNTER — Encounter: Payer: Self-pay | Admitting: Dermatology

## 2023-02-27 DIAGNOSIS — L570 Actinic keratosis: Secondary | ICD-10-CM

## 2023-02-27 DIAGNOSIS — Z872 Personal history of diseases of the skin and subcutaneous tissue: Secondary | ICD-10-CM

## 2023-02-27 DIAGNOSIS — L578 Other skin changes due to chronic exposure to nonionizing radiation: Secondary | ICD-10-CM

## 2023-02-27 DIAGNOSIS — W908XXA Exposure to other nonionizing radiation, initial encounter: Secondary | ICD-10-CM

## 2023-02-27 NOTE — Patient Instructions (Signed)
Actinic keratoses are precancerous spots that appear secondary to cumulative UV radiation exposure/sun exposure over time. They are chronic with expected duration over 1 year. A portion of actinic keratoses will progress to squamous cell carcinoma of the skin. It is not possible to reliably predict which spots will progress to skin cancer and so treatment is recommended to prevent development of skin cancer.  Recommend daily broad spectrum sunscreen SPF 30+ to sun-exposed areas, reapply every 2 hours as needed.  Recommend staying in the shade or wearing long sleeves, sun glasses (UVA+UVB protection) and wide brim hats (4-inch brim around the entire circumference of the hat). Call for new or changing lesions.   Cryotherapy Aftercare  Wash gently with soap and water everyday.   Apply Vaseline and Band-Aid daily until healed.     Due to recent changes in healthcare laws, you may see results of your pathology and/or laboratory studies on MyChart before the doctors have had a chance to review them. We understand that in some cases there may be results that are confusing or concerning to you. Please understand that not all results are received at the same time and often the doctors may need to interpret multiple results in order to provide you with the best plan of care or course of treatment. Therefore, we ask that you please give [place] 2 business days to thoroughly review all your results before contacting the office for clarification. Should we see a critical lab result, you will be contacted sooner.   If You Need Anything After Your Visit  If you have any questions or concerns for your doctor, please call our main line at [phone] and press option 4 to reach your doctor's medical assistant. If no one answers, please leave a voicemail as directed and we will return your call as soon as possible. Messages left after 4 pm will be answered the following business day.   You may also send [place] a message  via MyChart. We typically respond to MyChart messages within 1-2 business days.  For prescription refills, please ask your pharmacy to contact our office. Our fax number is [phone].  If you have an urgent issue when the clinic is closed that cannot wait until the next business day, you can page your doctor at the number below.    Please note that while we do our best to be available for urgent issues outside of office hours, we are not available 24/7.   If you have an urgent issue and are unable to reach [place], you may choose to seek medical care at your doctor's office, retail clinic, urgent care center, or emergency room.  If you have a medical emergency, please immediately call 911 or go to the emergency department.  Pager Numbers  - Dr. Gwen Pounds: [phone]  - Dr. Roseanne Reno: [phone]  - Dr. Katrinka Blazing: [phone]   In the event of inclement weather, please call our main line at [phone] for an update on the status of any delays or closures.  Dermatology Medication Tips: Please keep the boxes that topical medications come in in order to help keep track of the instructions about where and how to use these. Pharmacies typically print the medication instructions only on the boxes and not directly on the medication tubes.   If your medication is too expensive, please contact our office at [phone] option 4 or send [place] a message through MyChart.   We are unable to tell what your co-pay for medications will be in advance as  this is different depending on your insurance coverage. However, we may be able to find a substitute medication at lower cost or fill out paperwork to get insurance to cover a needed medication.   If a prior authorization is required to get your medication covered by your insurance company, please allow [place] 1-2 business days to complete this process.  Drug prices often vary depending on where the prescription is filled and some pharmacies may offer cheaper  prices.  The website www.goodrx.com contains coupons for medications through different pharmacies. The prices here do not account for what the cost may be with help from insurance (it may be cheaper with your insurance), but the website can give you the price if you did not use any insurance.  - You can print the associated coupon and take it with your prescription to the pharmacy.  - You may also stop by our office during regular business hours and pick up a GoodRx coupon card.  - If you need your prescription sent electronically to a different pharmacy, notify our office through [facility] or by phone at [phone] option 4.     Si Usted Necesita Algo Despus de Su Visita  Tambin puede enviarnos un mensaje a travs de Clinical cytogeneticist. Por lo general respondemos a los mensajes de MyChart en el transcurso de 1 a 2 das hbiles.  Para renovar recetas, por favor pida a su farmacia que se ponga en contacto con nuestra oficina. Annie Sable de fax es Bernice [phone].  Si tiene un asunto urgente cuando la clnica est cerrada y que no puede esperar hasta el siguiente da hbil, puede llamar/localizar a su doctor(a) al nmero que aparece a continuacin.   Por favor, tenga en cuenta que aunque hacemos todo lo posible para estar disponibles para asuntos urgentes fuera del horario de [place], no estamos disponibles las 24 horas del da, los 7 [address]  [address] de la [place].   Si tiene un problema urgente y no puede comunicarse con nosotros, puede optar por buscar atencin mdica  en el consultorio de su doctor(a), en una clnica privada, en un centro de atencin urgente o en una sala de emergencias.  Si tiene Engineer, drilling, por favor llame inmediatamente al 911 o vaya a la sala de emergencias.  Nmeros de bper  - Dr. Gwen Pounds: [phone]  - Dra. Roseanne Reno: [phone]  - Dr. Katrinka Blazing: [phone]   En caso de inclemencias del tiempo, por favor llame a Lacy Duverney principal al [phone]  para una actualizacin sobre el [place] de cualquier retraso o cierre.  Consejos para la medicacin en dermatologa: Por favor, guarde las cajas en las que vienen los medicamentos de uso tpico para ayudarle a seguir las instrucciones sobre dnde y cmo usarlos. Las farmacias generalmente imprimen las instrucciones del medicamento slo en las cajas y no directamente en los tubos del [place].   Si su medicamento es muy caro, por favor, pngase en contacto con Rolm Gala llamando al [phone] y presione la opcin 4 o envenos un mensaje a travs de Clinical cytogeneticist.   No podemos decirle cul ser su copago por los medicamentos por adelantado ya que esto es diferente dependiendo de la cobertura de su seguro. Sin embargo, es posible que podamos encontrar un medicamento sustituto a Audiological scientist un formulario para que el seguro cubra el medicamento que se considera necesario.   Si se requiere una autorizacin previa para que su compaa de seguros [place] su medicamento, por favor permtanos de 1 a 2 [address]  [address]  hbiles para completar este proceso.  Los precios de los medicamentos varan con frecuencia dependiendo del Environmental consultant de dnde se surte la receta y alguna farmacias pueden ofrecer precios ms baratos.  El sitio web www.goodrx.com tiene cupones para medicamentos de Health and safety inspector. Los precios aqu no tienen en cuenta lo que podra costar con la ayuda del seguro (puede ser ms barato con su seguro), pero el sitio web puede darle el precio si no utiliz Tourist information centre manager.  - Puede imprimir el cupn correspondiente y llevarlo con su receta a la farmacia.  - Tambin puede pasar por nuestra oficina durante el horario de atencin regular y Education officer, museum una tarjeta de cupones de GoodRx.  - Si necesita que su receta se enve electrnicamente a una farmacia diferente, informe a nuestra oficina a travs de MyChart de [place] o por telfono llamando al [phone] y presione la opcin 4.

## 2023-02-27 NOTE — Progress Notes (Signed)
 Follow-Up Visit   Subjective  Scott Cochran is a 73 y.o. male who presents for the following: patient reports some tenderness at right and left cheeks. Patient was using tacrolimus  ointment to face a few weeks ago.  Patient has also been using 60 mg of prednisone for last 4 days. Was seen by urgent care.   Patient was given rx of tacrolimus  ointment to use twice daily to hands for hand dermatitis.   Patient has history of multiple precancers at face.   The patient has spots, moles and lesions to be evaluated, some may be new or changing and the patient may have concern these could be cancer.   The following portions of the chart were reviewed this encounter and updated as appropriate: medications, allergies, medical history  Review of Systems:  No other skin or systemic complaints except as noted in HPI or Assessment and Plan.  Objective  Well appearing patient in no apparent distress; mood and affect are within normal limits.    A focused examination was performed of the following areas: Face, scalp, ears,  right arm   Relevant exam findings are noted in the Assessment and Plan.  left postauricular x 2, left earlobe x 1, left sideburn x 1, left tragus x 1, right medial cheek x 1, right zygoma x 2, right superior forehead x 1, left superior forehead x 1, right jawline x 1, right ac fossa x 1 (12) Erythematous thin papules/macules with gritty scale.   Assessment & Plan        ACTINIC KERATOSIS (12) left postauricular x 2, left earlobe x 1, left sideburn x 1, left tragus x 1, right medial cheek x 1, right zygoma x 2, right superior forehead x 1, left superior forehead x 1, right jawline x 1, right ac fossa x 1 (12) Reviewed course of treatment and expected reaction.  Patient advised to expect inflammation and crusting and advised that erosions are possible.  Patient advised to be diligent with sun protection during and after treatment. Counseled to keep medication out of reach  of children and pets.  Actinic keratoses are precancerous spots that appear secondary to cumulative UV radiation exposure/sun exposure over time. They are chronic with expected duration over 1 year. A portion of actinic keratoses will progress to squamous cell carcinoma of the skin. It is not possible to reliably predict which spots will progress to skin cancer and so treatment is recommended to prevent development of skin cancer.  - patient prefers to avoid 5FU given severe reaction last time (05/2019) - Stop tacrolimus  on the face Destruction of lesion - left postauricular x 2, left earlobe x 1, left sideburn x 1, left tragus x 1, right medial cheek x 1, right zygoma x 2, right superior forehead x 1, left superior forehead x 1, right jawline x 1, right ac fossa x 1 (12) Complexity: simple   Destruction method: cryotherapy   Informed consent: discussed and consent obtained   Timeout:  patient name, date of birth, surgical site, and procedure verified Lesion destroyed using liquid nitrogen: Yes   Region frozen until ice ball extended beyond lesion: Yes   Outcome: patient tolerated procedure well with no complications   Post-procedure details: wound care instructions given   Related Medications Calcipotriene  0.005 % solution APPLY TO ANY SCALY AREAS OF SCALP, FACE, AND EARS DAILY AS DIRECTED  Return for switch follow up patient is scheduled in 06/21/23 to Dr. Claudene 6 month ak follow up.  I, Melissa  Tanda, CMA, am acting as scribe for [name], MD.   Documentation: I have reviewed the above documentation for accuracy and completeness, and I agree with the above.  [name], MD

## 2023-03-14 ENCOUNTER — Telehealth: Payer: Self-pay

## 2023-03-14 NOTE — Telephone Encounter (Signed)
Patient called earlier and left message on nurse line concerning a refill or prescription to treat rough areas at face.   Called patient concerning his message. Patient states he was recently seen by Dr. Katrinka Blazing, in which several areas were treated at face and surrounding areas with Ln2. He is still bothered by rough areas mostly where he shaves. Patient questioned whether he needs to start 64f/u cream or other type cream to help with "bumps" at face.   Informed patient no prescriptions were recommended at last visit. Patient would like to ask provider if he needs any cream treatments at this time.  Routing to provider to advise.

## 2023-03-20 NOTE — Telephone Encounter (Signed)
Called patient back and discussed provider's response. Patient will hold off on restarting cream and will call us back if spots become bothersome before his next appointment.

## 2023-03-27 ENCOUNTER — Other Ambulatory Visit: Payer: Self-pay | Admitting: Dermatology

## 2023-03-27 DIAGNOSIS — L570 Actinic keratosis: Secondary | ICD-10-CM

## 2023-03-27 DIAGNOSIS — L409 Psoriasis, unspecified: Secondary | ICD-10-CM

## 2023-05-16 ENCOUNTER — Encounter: Payer: Self-pay | Admitting: Dermatology

## 2023-05-16 ENCOUNTER — Ambulatory Visit: Payer: Medicare HMO | Admitting: Dermatology

## 2023-05-16 DIAGNOSIS — L570 Actinic keratosis: Secondary | ICD-10-CM

## 2023-05-16 DIAGNOSIS — L820 Inflamed seborrheic keratosis: Secondary | ICD-10-CM

## 2023-05-16 DIAGNOSIS — L578 Other skin changes due to chronic exposure to nonionizing radiation: Secondary | ICD-10-CM

## 2023-05-16 DIAGNOSIS — W908XXA Exposure to other nonionizing radiation, initial encounter: Secondary | ICD-10-CM | POA: Diagnosis not present

## 2023-05-16 DIAGNOSIS — K219 Gastro-esophageal reflux disease without esophagitis: Secondary | ICD-10-CM | POA: Insufficient documentation

## 2023-05-16 DIAGNOSIS — L568 Other specified acute skin changes due to ultraviolet radiation: Secondary | ICD-10-CM | POA: Diagnosis not present

## 2023-05-16 DIAGNOSIS — Z5111 Encounter for antineoplastic chemotherapy: Secondary | ICD-10-CM | POA: Diagnosis not present

## 2023-05-16 DIAGNOSIS — Z7189 Other specified counseling: Secondary | ICD-10-CM

## 2023-05-16 MED ORDER — FLUOROURACIL 5 % EX CREA: TOPICAL_CREAM | Freq: Two times a day (BID) | CUTANEOUS | 2 refills | Status: DC

## 2023-05-16 NOTE — Progress Notes (Signed)
 Follow-Up Visit   Subjective  Scott Cochran is a 73 y.o. male who presents for the following: several spots at face, painful when shaving. Patient has had many AK's treated in the past with LN2.   Patient has been prescribed calcipotriene to use for psoriasis at face, scalp and ears.   The patient has spots, moles and lesions to be evaluated, some may be new or changing and the patient may have concern these could be cancer.   The following portions of the chart were reviewed this encounter and updated as appropriate: medications, allergies, medical history  Review of Systems:  No other skin or systemic complaints except as noted in HPI or Assessment and Plan.  Objective  Well appearing patient in no apparent distress; mood and affect are within normal limits.   A focused examination was performed of the following areas: Face, scalp, hands  Relevant exam findings are noted in the Assessment and Plan.  R zygoma x 1, R jawline x 1, R helix x 1, R sup forehead x 1, L mid cheek x 1, L sup forehead x 1, L jawline x 1, L lobe x 1, L scalp hair part x 1, R mid cheek x 1, R med cheek x 1, R postauricular x 1, R dorsal hand x 9, L forearm x 1, L med hand x 1 (23) Erythematous thin papules/macules with gritty scale.  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 - diffuse scaly erythematous macules and papules with underlying dyspigmentation - Discussed Prescription "Field Treatment" topical Chemotherapy for Severe, Chronic Confluent Actinic Changes with Pre-Cancerous Actinic  Keratoses Field treatment involves treatment of an entire area of skin that has confluent Actinic Changes (Sun/ Ultraviolet light damage) and PreCancerous Actinic Keratoses by method of PhotoDynamic Therapy (PDT) and/or prescription Topical Chemotherapy agents such as 5-fluorouracil, 5-fluorouracil/calcipotriene, and/or imiquimod.  The purpose is to decrease the number of clinically evident and subclinical PreCancerous lesions to prevent progression to development of skin cancer by chemically destroying early precancer changes that may or may not be visible.  It has been shown to reduce the risk of developing skin cancer in the treated area. As a result of treatment, redness, scaling, crusting, and open sores may occur during treatment course. One or more than one of these methods may be used and may have to be used several times to control, suppress and eliminate the PreCancerous changes. Discussed treatment course, expected reaction, and possible side effects. - Recommend daily broad spectrum sunscreen SPF 30+ to sun-exposed areas, reapply every 2 hours as needed.  - Staying in the shade or wearing long sleeves, sun glasses (UVA+UVB protection) and wide brim hats (4-inch brim around the entire circumference of the hat) are also recommended. - Call for new or changing lesions. - Patient would like to try topical 5FU/calcipotriene again. Reiterated that patient previously preferred to avoid 5FU given the intense reaction. - Discussed that lower lip gets a lot of sun. Patient has evidence of actinic cheilitis. Dr Gwen Pounds agrees - Start 5-fluorouracil/calcipotriene cream twice a day until red and irritated to affected areas including face (cheeks > neck > forehead temples  nose), lower lip. Prescription sent to [facility] $45 plus shipping, told patient to expect phone call. Patient advised they will receive a telephone call to purchase the medication online and have it sent to their home. Patient  provided with handout reviewing treatment course and side effects and advised to call or message [place] on MyChart with any concerns.  Reviewed course of treatment and expected reaction.  Patient advised to expect inflammation and crusting and advised that erosions are possible.  Patient advised to be diligent with sun protection during and after treatment. Counseled to keep medication out of reach of children and pets.   AK (ACTINIC KERATOSIS) (23) R zygoma x 1, R jawline x 1, R helix x 1, R sup forehead x 1, L mid cheek x 1, L sup forehead x 1, L jawline x 1, L lobe x 1, L scalp hair part x 1, R mid cheek x 1, R med cheek x 1, R postauricular x 1, R dorsal hand x 9, L forearm x 1, L med hand x 1 (23) Actinic keratoses are precancerous spots that appear secondary to cumulative UV radiation exposure/sun exposure over time. They are chronic with expected duration over 1 year. A portion of actinic keratoses will progress to squamous cell carcinoma of the skin. It is not possible to reliably predict which spots will progress to skin cancer and so treatment is recommended to prevent development of skin cancer.  Recommend daily broad spectrum sunscreen SPF 30+ to sun-exposed areas, reapply every 2 hours as needed.  Recommend staying in the shade or wearing long sleeves, sun glasses (UVA+UVB protection) and wide brim hats (4-inch brim around the entire circumference of the hat). Call for new or changing lesions.  Offered biopsy at left superior forehead, patient deferred. Consider biopsy if not improved on follow up.  Destruction of lesion - R zygoma x 1, R jawline x 1, R helix x 1, R sup forehead x 1, L mid cheek x 1, L sup forehead x 1, L jawline x 1, L lobe x 1, L scalp hair part x 1, R mid cheek x 1, R med cheek x 1, R postauricular x 1, R dorsal hand x 9, L forearm x 1, L med hand x 1 (23) Complexity: simple   Destruction method: cryotherapy   Informed consent: discussed and consent obtained   Timeout:   patient name, date of birth, surgical site, and procedure verified Lesion destroyed using liquid nitrogen: Yes   Region frozen until ice ball extended beyond lesion: Yes   Cryo cycles: 1 or 2. Outcome: patient tolerated procedure well with no complications   Post-procedure details: wound care instructions given   INFLAMED SEBORRHEIC KERATOSIS L upper chest x 1 Symptomatic, irritating, patient would like treated.  Benign-appearing.  Call clinic for new or changing lesions.   Destruction of lesion - L upper chest x 1 Complexity: simple   Destruction method: cryotherapy   Informed consent: discussed and consent obtained   Timeout:  patient name, date of birth, surgical site, and procedure verified Lesion destroyed using liquid nitrogen: Yes   Region frozen until ice ball extended beyond lesion: Yes   Cryo cycles: 1 or 2. Outcome: patient tolerated procedure well with no complications   Post-procedure details: wound care instructions given   ACTINIC CHEILITIS   COUNSELING AND COORDINATION OF CARE    Return for AK follow up, Psoriasis, as scheduled.  Anise Salvo, RMA, am acting as scribe for Elie Goody, MD .  Documentation: I have reviewed the above documentation for accuracy and completeness, and I agree with the above.  Elie Goody, MD

## 2023-05-16 NOTE — Patient Instructions (Addendum)
 - Start 5-fluorouracil/calcipotriene cream twice a day until red and irritated to affected areas including face, lower lip. Prescription sent to [facility]. Patient advised they will receive a telephone call to purchase the medication online and have it sent to their home. Patient provided with handout reviewing treatment course and side effects and advised to call or message [place] on MyChart with any concerns.  [facility] [address] [place], [place] [address]  Phone: [phone] TOLL-FREE: [phone]   5-Fluorouracil/Calcipotriene Patient Education   Actinic keratoses are the dry, red scaly spots on the skin caused by sun damage. A portion of these spots can turn into skin cancer with time, and treating them can help prevent development of skin cancer.   Treatment of these spots requires removal of the defective skin cells. There are various ways to remove actinic keratoses, including freezing with liquid nitrogen, treatment with creams, or treatment with a blue light procedure in the office.   5-fluorouracil cream is a topical cream used to treat actinic keratoses. It works by interfering with the growth of abnormal fast-growing skin cells, such as actinic keratoses. These cells peel off and are replaced by healthy ones.   5-fluorouracil/calcipotriene is a combination of the 5-fluorouracil cream with a vitamin D analog cream called calcipotriene. The calcipotriene alone does not treat actinic keratoses. However, when it is combined with 5-fluorouracil, it helps the 5-fluorouracil treat the actinic keratoses much faster so that the same results can be achieved with a much shorter treatment time.  INSTRUCTIONS FOR 5-FLUOROURACIL/CALCIPOTRIENE CREAM:   5-fluorouracil/calcipotriene cream typically only needs to be used for 4-7 days. A thin layer should be applied twice a day to the treatment areas recommended by your physician.   If your physician prescribed you separate  tubes of 5-fluourouracil and calcipotriene, apply a thin layer of 5-fluorouracil followed by a thin layer of calcipotriene.   Avoid contact with your eyes, nostrils, and mouth. Do not use 5-fluorouracil/calcipotriene cream on infected or open wounds.   You will develop redness, irritation and some crusting at areas where you have pre-cancer damage/actinic keratoses. IF YOU DEVELOP PAIN, BLEEDING, OR SIGNIFICANT CRUSTING, STOP THE TREATMENT EARLY - you have already gotten a good response and the actinic keratoses should clear up well.  Wash your hands after applying 5-fluorouracil 5% cream on your skin.   A moisturizer or sunscreen with a minimum SPF 30 should be applied each morning.   Once you have finished the treatment, you can apply a thin layer of Vaseline twice a day to irritated areas to soothe and calm the areas more quickly. If you experience significant discomfort, contact your physician.  For some patients it is necessary to repeat the treatment for best results.  SIDE EFFECTS: When using 5-fluorouracil/calcipotriene cream, you may have mild irritation, such as redness, dryness, swelling, or a mild burning sensation. This usually resolves within 2 weeks. The more actinic keratoses you have, the more redness and inflammation you can expect during treatment. Eye irritation has been reported rarely. If this occurs, please let us know.  If you have any trouble using this cream, please call the office. If you have any other questions about this information, please do not hesitate to ask me before you leave the office.  Cryotherapy Aftercare  Wash gently with soap and water everyday.   Apply Vaseline and Band-Aid daily until healed.   Due to recent changes in healthcare laws, you may see results of your pathology and/or laboratory studies on MyChart before the  doctors have had a chance to review them. We understand that in some cases there may be results that are confusing or concerning to  you. Please understand that not all results are received at the same time and often the doctors may need to interpret multiple results in order to provide you with the best plan of care or course of treatment. Therefore, we ask that you please give [place] 2 business days to thoroughly review all your results before contacting the office for clarification. Should we see a critical lab result, you will be contacted sooner.   If You Need Anything After Your Visit  If you have any questions or concerns for your doctor, please call our main line at [phone] and press option 4 to reach your doctor's medical assistant. If no one answers, please leave a voicemail as directed and we will return your call as soon as possible. Messages left after 4 pm will be answered the following business day.   You may also send [place] a message via MyChart. We typically respond to MyChart messages within 1-2 business days.  For prescription refills, please ask your pharmacy to contact our office. Our fax number is [phone].  If you have an urgent issue when the clinic is closed that cannot wait until the next business day, you can page your doctor at the number below.    Please note that while we do our best to be available for urgent issues outside of office hours, we are not available 24/7.   If you have an urgent issue and are unable to reach [place], you may choose to seek medical care at your doctor's office, retail clinic, urgent care center, or emergency room.  If you have a medical emergency, please immediately call 911 or go to the emergency department.  Pager Numbers  - Dr. Gwen Pounds: [phone]  - Dr. Roseanne Reno: [phone]  - Dr. Katrinka Blazing: [phone]   In the event of inclement weather, please call our main line at [phone] for an update on the status of any delays or closures.  Dermatology Medication Tips: Please keep the boxes that topical medications come in in order to help keep track of the  instructions about where and how to use these. Pharmacies typically print the medication instructions only on the boxes and not directly on the medication tubes.   If your medication is too expensive, please contact our office at [phone] option 4 or send [place] a message through MyChart.   We are unable to tell what your co-pay for medications will be in advance as this is different depending on your insurance coverage. However, we may be able to find a substitute medication at lower cost or fill out paperwork to get insurance to cover a needed medication.   If a prior authorization is required to get your medication covered by your insurance company, please allow [place] 1-2 business days to complete this process.  Drug prices often vary depending on where the prescription is filled and some pharmacies may offer cheaper prices.  The website www.goodrx.com contains coupons for medications through different pharmacies. The prices here do not account for what the cost may be with help from insurance (it may be cheaper with your insurance), but the website can give you the price if you did not use any insurance.  - You can print the associated coupon and take it with your prescription to the pharmacy.  - You may also stop by our office during regular business hours and  pick up a GoodRx coupon card.  - If you need your prescription sent electronically to a different pharmacy, notify our office through [facility] or by phone at [phone] option 4.     Si Usted Necesita Algo Despus de Su Visita  Tambin puede enviarnos un mensaje a travs de Clinical cytogeneticist. Por lo general respondemos a los mensajes de MyChart en el transcurso de 1 a 2 das hbiles.  Para renovar recetas, por favor pida a su farmacia que se ponga en contacto con nuestra oficina. Annie Sable de fax es [place] [phone].  Si tiene un asunto urgente cuando la clnica est cerrada y que no puede esperar hasta el siguiente da hbil,  puede llamar/localizar a su doctor(a) al nmero que aparece a continuacin.   Por favor, tenga en cuenta que aunque hacemos todo lo posible para estar disponibles para asuntos urgentes fuera del horario de [place], no estamos disponibles las 24 horas del da, los 7 [address]  [address] de la [place].   Si tiene un problema urgente y no puede comunicarse con nosotros, puede optar por buscar atencin mdica  en el consultorio de su doctor(a), en una clnica privada, en un centro de atencin urgente o en una sala de emergencias.  Si tiene Engineer, drilling, por favor llame inmediatamente al 911 o vaya a la sala de emergencias.  Nmeros de bper  - Dr. Gwen Pounds: [phone]  - Dra. Roseanne Reno: [phone]  - Dr. Katrinka Blazing: [phone]   En caso de inclemencias del tiempo, por favor llame a Lacy Duverney principal al [phone] para una actualizacin sobre el [employer] de cualquier retraso o cierre.  Consejos para la medicacin en dermatologa: Por favor, guarde las cajas en las que vienen los medicamentos de uso tpico para ayudarle a seguir las instrucciones sobre dnde y cmo usarlos. Las farmacias generalmente imprimen las instrucciones del medicamento slo en las cajas y no directamente en los tubos del [place].   Si su medicamento es muy caro, por favor, pngase en contacto con Rolm Gala llamando al [phone] y presione la opcin 4 o envenos un mensaje a travs de Clinical cytogeneticist.   No podemos decirle cul ser su copago por los medicamentos por adelantado ya que esto es diferente dependiendo de la cobertura de su seguro. Sin embargo, es posible que podamos encontrar un medicamento sustituto a Audiological scientist un formulario para que el seguro cubra el medicamento que se considera necesario.   Si se requiere una autorizacin previa para que su compaa de seguros [place] su medicamento, por favor permtanos de 1 a 2 das hbiles para completar [address]  Los precios de los medicamentos varan  con frecuencia dependiendo del Environmental consultant de dnde se surte la receta y alguna farmacias pueden ofrecer precios ms baratos.  El sitio web www.goodrx.com tiene cupones para medicamentos de Health and safety inspector. Los precios aqu no tienen en cuenta lo que podra costar con la ayuda del seguro (puede ser ms barato con su seguro), pero el sitio web puede darle el precio si no utiliz Tourist information centre manager.  - Puede imprimir el cupn correspondiente y llevarlo con su receta a la farmacia.  - Tambin puede pasar por nuestra oficina durante el horario de atencin regular y Education officer, museum una tarjeta de cupones de GoodRx.  - Si necesita que su receta se enve electrnicamente a una farmacia diferente, informe a nuestra oficina a travs de MyChart de  o por telfono llamando al [phone] y presione la opcin 4.

## 2023-06-05 ENCOUNTER — Telehealth: Payer: Self-pay

## 2023-06-05 NOTE — Telephone Encounter (Signed)
 Returned patient's call regarding how long to use 5FU/Calcipotriene . Advised until red and irritated which is anywhere from 5-7 days. Patient is on day 7. Advised he can stop now. Use Vaseline Jelly or Aquaphor ointment on face for healing.

## 2023-06-21 ENCOUNTER — Ambulatory Visit: Payer: Medicare HMO | Admitting: Dermatology

## 2023-08-28 ENCOUNTER — Encounter: Payer: Self-pay | Admitting: Dermatology

## 2023-08-28 ENCOUNTER — Ambulatory Visit: Payer: Medicare HMO | Admitting: Dermatology

## 2023-08-28 DIAGNOSIS — R238 Other skin changes: Secondary | ICD-10-CM

## 2023-08-28 DIAGNOSIS — L309 Dermatitis, unspecified: Secondary | ICD-10-CM | POA: Diagnosis not present

## 2023-08-28 DIAGNOSIS — L570 Actinic keratosis: Secondary | ICD-10-CM

## 2023-08-28 DIAGNOSIS — W908XXA Exposure to other nonionizing radiation, initial encounter: Secondary | ICD-10-CM

## 2023-08-28 DIAGNOSIS — L578 Other skin changes due to chronic exposure to nonionizing radiation: Secondary | ICD-10-CM

## 2023-08-28 DIAGNOSIS — L568 Other specified acute skin changes due to ultraviolet radiation: Secondary | ICD-10-CM | POA: Diagnosis not present

## 2023-08-28 DIAGNOSIS — Z5111 Encounter for antineoplastic chemotherapy: Secondary | ICD-10-CM | POA: Diagnosis not present

## 2023-08-28 DIAGNOSIS — Z872 Personal history of diseases of the skin and subcutaneous tissue: Secondary | ICD-10-CM

## 2023-08-28 DIAGNOSIS — I878 Other specified disorders of veins: Secondary | ICD-10-CM

## 2023-08-28 DIAGNOSIS — L409 Psoriasis, unspecified: Secondary | ICD-10-CM

## 2023-08-28 MED ORDER — TACROLIMUS 0.1 % EX OINT: TOPICAL_OINTMENT | Freq: Two times a day (BID) | CUTANEOUS | 4 refills | Status: AC

## 2023-08-28 MED ORDER — CALCIPOTRIENE 0.005 % EX SOLN: CUTANEOUS | 11 refills | Status: AC

## 2023-08-28 NOTE — Progress Notes (Signed)
 Follow-Up Visit   Subjective  Scott Cochran is a 73 y.o. male who presents for the following: AK and psoriasis f/u. Patient used 5FU for x7 days and did have good reaction. Uses calcipotriene  to scalp. Patient also requesting refill fot medication for hands.   The patient has spots, moles and lesions to be evaluated, some may be new or changing and the patient may have concern these could be cancer.   The following portions of the chart were reviewed this encounter and updated as appropriate: medications, allergies, medical history  Review of Systems:  No other skin or systemic complaints except as noted in HPI or Assessment and Plan.  Objective  Well appearing patient in no apparent distress; mood and affect are within normal limits.  A focused examination was performed of the following areas: Face, scalp, arms, hands  Relevant exam findings are noted in the Assessment and Plan.    Assessment & Plan   PSORIASIS Exam: mild scattered scaling of scalp 1% BSA.  Chronic and persistent condition with duration or expected duration over one year. Condition is improving with treatment but not currently at goal.   patient denies joint pain  Psoriasis is a chronic non-curable, but treatable genetic/hereditary disease that may have other systemic features affecting other organ systems such as joints (Psoriatic Arthritis). It is associated with an increased risk of inflammatory bowel disease, heart disease, non-alcoholic fatty liver disease, and depression.  Treatments include light and laser treatments; topical medications; and systemic medications including oral and injectables.  Treatment Plan: Continue Calcipotriene  0.005 % solution apply topically to any scaly areas of scalp face, ears daily     HISTORY OF PRECANCEROUS ACTINIC KERATOSIS s/p 5FU - site(s) of PreCancerous Actinic Keratosis clear today. 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 - diffuse scaly erythematous macules and papules with underlying dyspigmentation - Discussed Prescription Field Treatment topical Chemotherapy for Severe, Chronic Confluent Actinic Changes with Pre-Cancerous Actinic Keratoses Field treatment involves treatment of an entire area of skin that has confluent Actinic Changes (Sun/ Ultraviolet light damage) and PreCancerous Actinic Keratoses by method of PhotoDynamic Therapy (PDT) and/or prescription Topical Chemotherapy agents such as 5-fluorouracil , 5-fluorouracil /calcipotriene , and/or imiquimod.  The purpose is to decrease the number of clinically evident and subclinical PreCancerous lesions to prevent progression to development of skin cancer by chemically destroying early precancer changes that may or may not be visible.  It has been shown to reduce the risk of developing skin cancer in the treated area. As a result of treatment, redness, scaling, crusting, and open sores may occur during treatment course. One or more than one of these methods may be used and may have to be used several times to control, suppress and eliminate the PreCancerous changes. Discussed treatment course, expected reaction, and possible side effects. - Recommend daily broad spectrum sunscreen SPF 30+  to sun-exposed areas, reapply every 2 hours as needed.  - Staying in the shade or wearing long sleeves, sun glasses (UVA+UVB protection) and wide brim hats (4-inch brim around the entire circumference of the hat) are also recommended. - Call for new or changing lesions. - Start 5-fluorouracil /calcipotriene  cream twice a day until red and irritated to affected areas including lower lip, lips usually react quicker, discontinue as soon as start feeling irritated then apply Vaseline. Prescription sent to [facility] $45 plus shipping, told patient to expect phone call. Patient advised they will receive a telephone call to purchase the medication online and have it sent to their home. Patient provided with handout reviewing treatment course and side effects and advised to call or message us  on MyChart with any concerns.   Reviewed course of treatment and expected reaction.  Patient advised to expect inflammation and crusting and advised that erosions are possible.  Patient advised to be diligent with sun protection during and after treatment. Counseled to keep medication out of reach of children and pets.   [place] Exam: red or purple papule at vermilion lip  Treatment Plan: Benign-appearing. Observe   HAND DERMATITIS  Exam: Xerotic scaly plaque of right palm  Chronic flaring not at patient goal Plan: Continue tacrolimus  ointment BID until smooth Related Medications tacrolimus  (PROTOPIC ) 0.1 % ointment Apply topically 2 (two) times daily. PSORIASIS   Related Medications Calcipotriene  0.005 % solution APPLY  SOLUTION TOPICALLY ONCE DAILY TO ANY SCALY AREAS OF SCALP, FACE AND EARS AS DIRECTED ACTINIC KERATOSIS   Related Medications Calcipotriene  0.005 % solution APPLY  SOLUTION TOPICALLY ONCE DAILY TO ANY SCALY AREAS OF SCALP, FACE AND EARS AS DIRECTED [place]   ACTINIC CHEILITIS   Related Medications fluorouracil  (EFUDEX ) 5 % cream Apply topically 2 (two) times  daily. Until red and irritated to affected areas at face and lower lip  Return in about 6 months (around 02/28/2024) for AK follow-up, Psoriasis, w/ Dr. Claudene.  I, Jacquelynn V. Wilfred, CMA, am acting as scribe for Boneta Claudene, MD .   Documentation: I have reviewed the above documentation for accuracy and completeness, and I agree with the above.  Boneta Claudene, MD

## 2023-08-28 NOTE — Patient Instructions (Addendum)
 - Start 5-fluorouracil /calcipotriene  cream twice a day until red and irritated to affected areas including lower lip, lips usually react quicker, discontinue as soon as start feeling irritated then apply Vaseline. Prescription sent to [facility] $45 plus shipping, told patient to expect phone call. Patient advised they will receive a telephone call to purchase the medication online and have it sent to their home. Patient provided with handout reviewing treatment course and side effects and advised to call or message us  on MyChart with any concerns.   Reviewed course of treatment and expected reaction.  Patient advised to expect inflammation and crusting and advised that erosions are possible.  Patient advised to be diligent with sun protection during and after treatment. Counseled to keep medication out of reach of children and pets.    Due to recent changes in healthcare laws, you may see results of your pathology and/or laboratory studies on MyChart before the doctors have had a chance to review them. We understand that in some cases there may be results that are confusing or concerning to you. Please understand that not all results are received at the same time and often the doctors may need to interpret multiple results in order to provide you with the best plan of care or course of treatment. Therefore, we ask that you please give us  2 business days to thoroughly review all your results before contacting the office for clarification. Should we see a critical lab result, you will be contacted sooner.   If You Need Anything After Your Visit  If you have any questions or concerns for your doctor, please call our main line at [phone] and press option 4 to reach your doctor's medical assistant. If no one answers, please leave a voicemail as directed and we will return your call as soon as possible. Messages left after 4 pm will be answered the following business day.   You may also send  us  a message via MyChart. We typically respond to MyChart messages within 1-2 business days.  For prescription refills, please ask your pharmacy to contact our office. Our fax number is [phone].  If you have an urgent issue when the clinic is closed that cannot wait until the next business day, you can page your doctor at the number below.    Please note that while we do our best to be available for urgent issues outside of office hours, we are not available 24/7.   If you have an urgent issue and are unable to reach us , you may choose to seek medical care at your doctor's office, retail clinic, urgent care center, or emergency room.  If you have a medical emergency, please immediately call 911 or go to the emergency department.  Pager Numbers  - Dr. Hester: [phone]  - Dr. Jackquline: [phone]  - Dr. Claudene: [phone]   In the event of inclement weather, please call our main line at [phone] for an update on the status of any delays or closures.  Dermatology Medication Tips: Please keep the boxes that topical medications come in in order to help keep track of the instructions about where and how to use these. Pharmacies typically print the medication instructions only on the boxes and not directly on the medication tubes.   If your medication is too expensive, please contact our office at [phone] option 4 or send us  a message through MyChart.   We are unable to tell what your co-pay for medications will be in advance as this is  different depending on your insurance coverage. However, we may be able to find a substitute medication at lower cost or fill out paperwork to get insurance to cover a needed medication.   If a prior authorization is required to get your medication covered by your insurance company, please allow us  1-2 business days to complete this process.  Drug prices often vary depending on where the prescription is filled and some pharmacies may  offer cheaper prices.  The website www.goodrx.com contains coupons for medications through different pharmacies. The prices here do not account for what the cost may be with help from insurance (it may be cheaper with your insurance), but the website can give you the price if you did not use any insurance.  - You can print the associated coupon and take it with your prescription to the pharmacy.  - You may also stop by our office during regular business hours and pick up a GoodRx coupon card.  - If you need your prescription sent electronically to a different pharmacy, notify our office through [facility] or by phone at [phone] option 4.     Si Usted Necesita Algo Despus de Su Visita  Tambin puede enviarnos un mensaje a travs de Clinical cytogeneticist. Por lo general respondemos a los mensajes de MyChart en el transcurso de 1 a 2 das hbiles.  Para renovar recetas, por favor pida a su farmacia que se ponga en contacto con nuestra oficina. Randi lakes de fax es [place] [phone].  Si tiene un asunto urgente cuando la clnica est cerrada y que no puede esperar hasta el siguiente da hbil, puede llamar/localizar a su doctor(a) al nmero que aparece a continuacin.   Por favor, tenga en cuenta que aunque hacemos todo lo posible para estar disponibles para asuntos urgentes fuera del horario de [place], no estamos disponibles las 24 horas del da, los 7 [address]  [address] de la [place].   Si tiene un problema urgente y no puede comunicarse con nosotros, puede optar por buscar atencin mdica  en el consultorio de su doctor(a), en una clnica privada, en un centro de atencin urgente o en una sala de emergencias.  Si tiene Engineer, drilling, por favor llame inmediatamente al 911 o vaya a la sala de emergencias.  Nmeros de bper  - Dr. Hester: [phone]  - Dra. Jackquline: [phone]  - Dr. Claudene: [phone]   En caso de inclemencias del tiempo, por favor llame a landry capes principal al  [phone] para una actualizacin sobre el [place] de cualquier retraso o cierre.  Consejos para la medicacin en dermatologa: Por favor, guarde las cajas en las que vienen los medicamentos de uso tpico para ayudarle a seguir las instrucciones sobre dnde y cmo usarlos. Las farmacias generalmente imprimen las instrucciones del medicamento slo en las cajas y no directamente en los tubos del [place].   Si su medicamento es muy caro, por favor, pngase en contacto con landry rieger llamando al [phone] y presione la opcin 4 o envenos un mensaje a travs de Clinical cytogeneticist.   No podemos decirle cul ser su copago por los medicamentos por adelantado ya que esto es diferente dependiendo de la cobertura de su seguro. Sin embargo, es posible que podamos encontrar un medicamento sustituto a Audiological scientist un formulario para que el seguro cubra el medicamento que se considera necesario.   Si se requiere una autorizacin previa para que su compaa de seguros malta su medicamento, por favor permtanos de 1 a 2 das hbiles para  completar este proceso.  Los precios de los medicamentos varan con frecuencia dependiendo del Environmental consultant de dnde se surte la receta y alguna farmacias pueden ofrecer precios ms baratos.  El sitio web www.goodrx.com tiene cupones para medicamentos de Health and safety inspector. Los precios aqu no tienen en cuenta lo que podra costar con la ayuda del seguro (puede ser ms barato con su seguro), pero el sitio web puede darle el precio si no utiliz Tourist information centre manager.  - Puede imprimir el cupn correspondiente y llevarlo con su receta a la farmacia.  - Tambin puede pasar por nuestra oficina durante el horario de atencin regular y Education officer, museum una tarjeta de cupones de GoodRx.  - Si necesita que su receta se enve electrnicamente a una farmacia diferente, informe a nuestra oficina a travs de MyChart de [place] o por telfono llamando al [phone] y presione la opcin 4.

## 2023-12-28 ENCOUNTER — Ambulatory Visit: Admitting: Dermatology

## 2023-12-28 ENCOUNTER — Encounter: Payer: Self-pay | Admitting: Dermatology

## 2023-12-28 DIAGNOSIS — L720 Epidermal cyst: Secondary | ICD-10-CM

## 2023-12-28 DIAGNOSIS — L570 Actinic keratosis: Secondary | ICD-10-CM

## 2023-12-28 DIAGNOSIS — L578 Other skin changes due to chronic exposure to nonionizing radiation: Secondary | ICD-10-CM | POA: Diagnosis not present

## 2023-12-28 DIAGNOSIS — L820 Inflamed seborrheic keratosis: Secondary | ICD-10-CM

## 2023-12-28 DIAGNOSIS — L304 Erythema intertrigo: Secondary | ICD-10-CM

## 2023-12-28 DIAGNOSIS — W908XXA Exposure to other nonionizing radiation, initial encounter: Secondary | ICD-10-CM

## 2023-12-28 MED ORDER — HYDROCORTISONE 2.5 % EX CREA: TOPICAL_CREAM | Freq: Two times a day (BID) | CUTANEOUS | 3 refills | Status: AC | PRN

## 2023-12-28 MED ORDER — KETOCONAZOLE 2 % EX CREA: 1.0000 | TOPICAL_CREAM | Freq: Two times a day (BID) | CUTANEOUS | 3 refills | Status: AC

## 2023-12-28 NOTE — Progress Notes (Signed)
 Follow-Up Visit   Subjective  Scott Cochran is a 73 y.o. male who presents for the following: check bump scalp,~73m,  itchy, pt tried 5FU/Calcipotriene  and no improvement,  Psoriasis scalp, pt using Calcipotriene  sol 4x/wk scalp still itching, check scaly spots face, bump lower lip ~18m, it   The following portions of the chart were reviewed this encounter and updated as appropriate: medications, allergies, medical history  Review of Systems:  No other skin or systemic complaints except as noted in HPI or Assessment and Plan.  Objective  Well appearing patient in no apparent distress; mood and affect are within normal limits.   A focused examination was performed of the following areas: scalp  Relevant exam findings are noted in the Assessment and Plan.  post vertex scalp x 1 Stuck on waxy paps with erythema R temple x 1, L zygoma x 1 (2) Pink scaly macules  Assessment & Plan     INFLAMED SEBORRHEIC KERATOSIS post vertex scalp x 1 Symptomatic, irritating, patient would like treated. Destruction of lesion - post vertex scalp x 1 Complexity: simple   Destruction method: cryotherapy   Informed consent: discussed and consent obtained   Timeout:  patient name, date of birth, surgical site, and procedure verified Lesion destroyed using liquid nitrogen: Yes   Region frozen until ice ball extended beyond lesion: Yes   Cryo cycles: 1 or 2. Outcome: patient tolerated procedure well with no complications   Post-procedure details: wound care instructions given    AK (ACTINIC KERATOSIS) (2) R temple x 1, L zygoma x 1 (2) Actinic keratoses are precancerous spots that appear secondary to cumulative UV radiation exposure/sun exposure over time. They are chronic with expected duration over 1 year. A portion of actinic keratoses will progress to squamous cell carcinoma of the skin. It is not possible to reliably predict which spots will progress to skin cancer and so treatment is  recommended to prevent development of skin cancer.  Recommend daily broad spectrum sunscreen SPF 30+ to sun-exposed areas, reapply every 2 hours as needed.  Recommend staying in the shade or wearing long sleeves, sun glasses (UVA+UVB protection) and wide brim hats (4-inch brim around the entire circumference of the hat). Call for new or changing lesions. Destruction of lesion - R temple x 1, L zygoma x 1 (2) Complexity: simple   Destruction method: cryotherapy   Informed consent: discussed and consent obtained   Timeout:  patient name, date of birth, surgical site, and procedure verified Lesion destroyed using liquid nitrogen: Yes   Region frozen until ice ball extended beyond lesion: Yes   Cryo cycles: 1 or 2. Outcome: patient tolerated procedure well with no complications   Post-procedure details: wound care instructions given     ACTINIC DAMAGE - chronic, secondary to cumulative UV radiation exposure/sun exposure over time - diffuse scaly erythematous macules with underlying dyspigmentation - Recommend daily broad spectrum sunscreen SPF 30+ to sun-exposed areas, reapply every 2 hours as needed.  - Recommend staying in the shade or wearing long sleeves, sun glasses (UVA+UVB protection) and wide brim hats (4-inch brim around the entire circumference of the hat). - Call for new or changing lesions.   Milia Lower lip - tiny firm white papules - type of cyst - benign - sometimes these will clear with nightly OTC adapalene/Differin 0.1% gel or retinol. 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 Condition is bothersome/symptomatic for patient. Currently flared.   Intertrigo is a chronic recurrent rash that occurs in skin fold areas that may be associated with friction; heat; moisture; yeast; fungus; and bacteria.  It is exacerbated by  increased movement / activity; sweating; and higher atmospheric temperature.  Use of an absorbant powder such as Zeasorb AF powder or other OTC antifungal powder to the area daily can prevent rash recurrence. Other options to help keep the area dry include blow drying the area after bathing or using antiperspirant products such as Duradry sweat minimizing gel.  Treatment Plan: Start Ketoconazole 2% cream bid until clear then prn flares Start HC 2.5% cream bid until clear then prn flares  Topical steroids (such as triamcinolone, fluocinolone, fluocinonide, mometasone, clobetasol, halobetasol, betamethasone, hydrocortisone) can cause thinning and lightening of the skin if they are used for too long in the same area. Your physician has selected the right strength medicine for your problem and area affected on the body. Please use your medication only as directed by your physician to prevent side effects.     Return in about 6 months (around 06/26/2024) for TBSE, Hx of AKs.  I, Scott Cochran, RMA, am acting as scribe for [name], MD .   Documentation: I have reviewed the above documentation for accuracy and completeness, and I agree with the above.  [name], MD

## 2023-12-28 NOTE — Patient Instructions (Addendum)
 Cryotherapy Aftercare  Wash gently with soap and water everyday.   Apply Vaseline and Band-Aid daily until healed.   Start/continue ketoconazole 2% cream twice a day as needed for rash.  Start/continue hydrocortisone 2.5% cream twice a day for up to 2 weeks as needed for rash.   Mix equal amounts of hydrocortisone 2.5% cream with ketaconazole 2% cream and apply to affected areas twice a day.  If improved, decrease to hydrocortisone and ketaconazole mixed once a day. If still clear, decrease to ketaconazole cream only, once daily to help prevent flares.  Do not use the hydrocortisone cream for maintenance, since long term use of a topical steroid can thin the skin.  May repeat regimen as needed for flares.   Topical steroids (such as triamcinolone, fluocinolone, fluocinonide, mometasone, clobetasol, halobetasol, betamethasone, hydrocortisone) can cause thinning and lightening of the skin if they are used for too long in the same area. Your physician has selected the right strength medicine for your problem and area affected on the body. Please use your medication only as directed by your physician to prevent side effects.   Due to recent changes in healthcare laws, you may see results of your pathology and/or laboratory studies on MyChart before the doctors have had a chance to review them. We understand that in some cases there may be results that are confusing or concerning to you. Please understand that not all results are received at the same time and often the doctors may need to interpret multiple results in order to provide you with the best plan of care or course of treatment. Therefore, we ask that you please give us  2 business days to thoroughly review all your results before contacting the office for clarification. Should we see a critical lab result, you will be contacted sooner.   If You Need Anything After Your Visit  If you have any questions or concerns for your doctor, please call  our main line at [phone] and press option 4 to reach your doctor's medical assistant. If no one answers, please leave a voicemail as directed and we will return your call as soon as possible. Messages left after 4 pm will be answered the following business day.   You may also send us  a message via MyChart. We typically respond to MyChart messages within 1-2 business days.  For prescription refills, please ask your pharmacy to contact our office. Our fax number is [phone].  If you have an urgent issue when the clinic is closed that cannot wait until the next business day, you can page your doctor at the number below.    Please note that while we do our best to be available for urgent issues outside of office hours, we are not available 24/7.   If you have an urgent issue and are unable to reach us , you may choose to seek medical care at your doctor's office, retail clinic, urgent care center, or emergency room.  If you have a medical emergency, please immediately call 911 or go to the emergency department.  Pager Numbers  - Dr. Hester: [phone]  - Dr. Jackquline: [phone]  - Dr. Claudene: [phone]   - Dr. Raymund: [phone]  In the event of inclement weather, please call our main line at [phone] for an update on the status of any delays or closures.  Dermatology Medication Tips: Please keep the boxes that topical medications come in in order to help keep track of the instructions about where and how to use these.  Pharmacies typically print the medication instructions only on the boxes and not directly on the medication tubes.   If your medication is too expensive, please contact our office at [phone] option 4 or send us  a message through MyChart.   We are unable to tell what your co-pay for medications will be in advance as this is different depending on your insurance coverage. However, we may be able to find a substitute medication at lower cost or fill  out paperwork to get insurance to cover a needed medication.   If a prior authorization is required to get your medication covered by your insurance company, please allow us  1-2 business days to complete this process.  Drug prices often vary depending on where the prescription is filled and some pharmacies may offer cheaper prices.  The website www.goodrx.com contains coupons for medications through different pharmacies. The prices here do not account for what the cost may be with help from insurance (it may be cheaper with your insurance), but the website can give you the price if you did not use any insurance.  - You can print the associated coupon and take it with your prescription to the pharmacy.  - You may also stop by our office during regular business hours and pick up a GoodRx coupon card.  - If you need your prescription sent electronically to a different pharmacy, notify our office through [facility] or by phone at [phone] option 4.     Si Usted Necesita Algo Despus de Su Visita  Tambin puede enviarnos un mensaje a travs de Clinical Cytogeneticist. Por lo general respondemos a los mensajes de MyChart en el transcurso de 1 a 2 das hbiles.  Para renovar recetas, por favor pida a su farmacia que se ponga en contacto con nuestra oficina. Randi lakes de fax es [place] [phone].  Si tiene un asunto urgente cuando la clnica est cerrada y que no puede esperar hasta el siguiente da hbil, puede llamar/localizar a su doctor(a) al nmero que aparece a continuacin.   Por favor, tenga en cuenta que aunque hacemos todo lo posible para estar disponibles para asuntos urgentes fuera del horario de [place], no estamos disponibles las 24 horas del da, los 7 [address]  [address] de la [place].   Si tiene un problema urgente y no puede comunicarse con nosotros, puede optar por buscar atencin mdica  en el consultorio de su doctor(a), en una clnica privada, en un centro de atencin urgente o en una sala de  emergencias.  Si tiene engineer, drilling, por favor llame inmediatamente al 911 o vaya a la sala de emergencias.  Nmeros de bper  - Dr. Hester: [phone]  - Dra. Jackquline: [phone]  - Dr. Claudene: [phone]  - Dra. Kitts: [phone]  En caso de inclemencias del [place], por favor llame a nuestra lnea principal al [phone] para una actualizacin sobre el [place] de cualquier retraso o cierre.  Consejos para la medicacin en dermatologa: Por favor, guarde las cajas en las que vienen los medicamentos de uso tpico para ayudarle a seguir las instrucciones sobre dnde y cmo usarlos. Las farmacias generalmente imprimen las instrucciones del medicamento slo en las cajas y no directamente en los tubos del [place].   Si su medicamento es muy caro, por favor, pngase en contacto con landry rieger llamando al [phone] y presione la opcin 4 o envenos un mensaje a travs de Clinical Cytogeneticist.   No podemos decirle cul ser su copago por los medicamentos por adelantado ya que esto es  diferente dependiendo de la cobertura de su seguro. Sin embargo, es posible que podamos encontrar un medicamento sustituto a audiological scientist un formulario para que el seguro cubra el medicamento que se considera necesario.   Si se requiere una autorizacin previa para que su compaa de seguros cubra su medicamento, por favor permtanos de 1 a 2 das hbiles para completar este proceso.  Los precios de los medicamentos varan con frecuencia dependiendo del environmental consultant de dnde se surte la receta y alguna farmacias pueden ofrecer precios ms baratos.  El sitio web www.goodrx.com tiene cupones para medicamentos de health and safety inspector. Los precios aqu no tienen en cuenta lo que podra costar con la ayuda del seguro (puede ser ms barato con su seguro), pero el sitio web puede darle el precio si no utiliz tourist information centre manager.  - Puede imprimir el cupn correspondiente y llevarlo con su receta a la  farmacia.  - Tambin puede pasar por nuestra oficina durante el horario de atencin regular y education officer, museum una tarjeta de cupones de GoodRx.  - Si necesita que su receta se enve electrnicamente a una farmacia diferente, informe a nuestra oficina a travs de MyChart de [place] o por telfono llamando al [phone] y presione la opcin 4.

## 2024-02-26 ENCOUNTER — Ambulatory Visit: Admitting: Dermatology

## 2024-03-03 ENCOUNTER — Other Ambulatory Visit: Payer: Self-pay

## 2024-03-03 ENCOUNTER — Emergency Department
Admission: EM | Admit: 2024-03-03 | Discharge: 2024-03-03 | Disposition: A | Discharge status: Home / Self Care | Attending: Emergency Medicine | Admitting: Emergency Medicine

## 2024-03-03 DIAGNOSIS — I10 Essential (primary) hypertension: Secondary | ICD-10-CM | POA: Diagnosis present

## 2024-03-03 DIAGNOSIS — Z79899 Other long term (current) drug therapy: Secondary | ICD-10-CM | POA: Diagnosis not present

## 2024-03-03 LAB — CBC WITH DIFFERENTIAL/PLATELET
Abs Immature Granulocytes: 0.04 K/uL (ref 0.00–0.07)
Basophils Absolute: 0 K/uL (ref 0.0–0.1)
Basophils Relative: 1 %
Eosinophils Absolute: 0.2 K/uL (ref 0.0–0.5)
Eosinophils Relative: 4 %
HCT: 42.4 % (ref 39.0–52.0)
Hemoglobin: 13.7 g/dL (ref 13.0–17.0)
Immature Granulocytes: 1 %
Lymphocytes Relative: 38 %
Lymphs Abs: 2.1 K/uL (ref 0.7–4.0)
MCH: 30.4 pg (ref 26.0–34.0)
MCHC: 32.3 g/dL (ref 30.0–36.0)
MCV: 94 fL (ref 80.0–100.0)
Monocytes Absolute: 0.5 K/uL (ref 0.1–1.0)
Monocytes Relative: 10 %
Neutro Abs: 2.6 K/uL (ref 1.7–7.7)
Neutrophils Relative %: 46 %
Platelets: 179 K/uL (ref 150–400)
RBC: 4.51 MIL/uL (ref 4.22–5.81)
RDW: 13.7 % (ref 11.5–15.5)
WBC: 5.5 K/uL (ref 4.0–10.5)
nRBC: 0 % (ref 0.0–0.2)

## 2024-03-03 LAB — URINALYSIS, ROUTINE W REFLEX MICROSCOPIC
Bilirubin Urine: NEGATIVE
Glucose, UA: NEGATIVE mg/dL
Hgb urine dipstick: NEGATIVE
Ketones, ur: NEGATIVE mg/dL
Leukocytes,Ua: NEGATIVE
Nitrite: NEGATIVE
Protein, ur: NEGATIVE mg/dL
Specific Gravity, Urine: 1.01 (ref 1.005–1.030)
pH: 5 (ref 5.0–8.0)

## 2024-03-03 LAB — TROPONIN T, HIGH SENSITIVITY: Troponin T High Sensitivity: <15 ng/L (ref 0–19)

## 2024-03-03 LAB — BASIC METABOLIC PANEL WITH GFR
Anion gap: 9 (ref 5–15)
BUN: 14 mg/dL (ref 8–23)
CO2: 28 mmol/L (ref 22–32)
Calcium: 10 mg/dL (ref 8.9–10.3)
Chloride: 103 mmol/L (ref 98–111)
Creatinine, Ser: 1.05 mg/dL (ref 0.61–1.24)
GFR, Estimated: >60 mL/min
Glucose, Bld: 101 mg/dL — ABNORMAL HIGH (ref 70–99)
Potassium: 4.2 mmol/L (ref 3.5–5.1)
Sodium: 140 mmol/L (ref 135–145)

## 2024-03-03 NOTE — ED Notes (Signed)
 Pt ambulatory to bathroom with no assistance

## 2024-03-03 NOTE — ED Triage Notes (Signed)
 Pt presents today wanting his BP checked because his home cuff said his was high. Pt takes HTN medication and is compliant. Pt says his BP has been in the 140's/150's. Pt denies any chest pain, headache, SOB, etc.

## 2024-03-03 NOTE — ED Provider Notes (Signed)
 "  [facility] Provider Note    Event Date/Time   First MD Initiated Contact with Patient 03/03/24 1231     (approximate)   History   Hypertension   HPI  Scott Cochran is a 74 y.o. male who presents to the ED for evaluation of Hypertension   I reviewed PCP visit from 5 days ago.  History of HTN on bisoprolol /hydrochlorothiazide  Patient presents to the ED due to elevated blood pressures at home this morning .  BP approximately 200/100.  No acute events at home, no recent illnesses, he is asymptomatic and reports feeling fine here in the ED but surprised by such a high number.  Reports compliance with his antihypertensive medication regimen   Physical Exam   Triage Vital Signs: ED Triage Vitals [03/03/24 1201]  Encounter Vitals Group     BP (!) 200/109     Girls Systolic BP Percentile      Girls Diastolic BP Percentile      Boys Systolic BP Percentile      Boys Diastolic BP Percentile      Pulse Rate 63     Resp 18     Temp      Temp src      SpO2 98 %     Weight 180 lb (81.6 kg)     Height 5' 9 (1.753 m)     Head Circumference      Peak Flow      Pain Score 0     Pain Loc      Pain Education      Exclude from Growth Chart     Most recent vital signs: Vitals:   03/03/24 1201  BP: (!) 200/109  Pulse: 63  Resp: 18  SpO2: 98%    General: Awake, no distress.  CV:  Good peripheral perfusion.  Resp:  Normal effort.  Abd:  No distention.  MSK:  No deformity noted.  Neuro:  No focal deficits appreciated. Other:     ED Results / Procedures / Treatments   Labs (all labs ordered are listed, but only abnormal results are displayed) Labs Reviewed  CBC WITH DIFFERENTIAL/PLATELET  BASIC METABOLIC PANEL WITH GFR  URINALYSIS, ROUTINE W REFLEX MICROSCOPIC  TROPONIN T, HIGH SENSITIVITY    EKG   RADIOLOGY   Official radiology report(s): No results found.  PROCEDURES and INTERVENTIONS:  Procedures  Medications - No data to  display   IMPRESSION / MDM / ASSESSMENT AND PLAN / ED COURSE  I reviewed the triage vital signs and the nursing notes.  Differential diagnosis includes, but is not limited to, medication noncompliance, no pathology, renal dysfunction or renal failure, ACS  {Patient presents with symptoms of an acute illness or injury that is potentially life-threatening.  Patient presents to the ED with asymptomatic hypertension suitable for outpatient management.  Normal CBC, metabolic panel, troponin and UA without evidence of endorgan damage.  He is asymptomatic and suitable for outpatient management.    Clinical Course as of 03/03/24 1308  Sun Mar 03, 2024  1308 Reassessed, remains asymptomatic, repeat BP 152/99.  Discussed care at home, PCP follow-up and ED return precautions [DS]    Clinical Course User Index [DS] [name], MD     FINAL CLINICAL IMPRESSION(S) / ED DIAGNOSES   Final diagnoses:  None     Rx / DC Orders   ED Discharge Orders     None        Note:  This document was prepared using Dragon voice recognition software and may include unintentional dictation errors.   [name], MD 03/03/24 1309  "

## 2024-06-27 ENCOUNTER — Ambulatory Visit: Admitting: Dermatology
